# Patient Record
Sex: Female | Born: 1945 | Race: Black or African American | Hispanic: No | Smoking: Current every day smoker
Health system: Southern US, Community
[De-identification: ages and names within clinical notes are randomized; demographics above are authoritative.]

## PROBLEM LIST (undated history)

## (undated) DIAGNOSIS — C801 Malignant (primary) neoplasm, unspecified: Secondary | ICD-10-CM

## (undated) HISTORY — DX: Malignant (primary) neoplasm, unspecified: C80.1

## (undated) HISTORY — PX: COLECTOMY: SHX59

## (undated) HISTORY — PX: BELPHAROPTOSIS REPAIR: SHX369

## (undated) HISTORY — PX: OTHER SURGICAL HISTORY: SHX169

---

## 1998-02-03 ENCOUNTER — Ambulatory Visit (HOSPITAL_COMMUNITY): Admission: RE | Admit: 1998-02-03 | Discharge: 1998-02-03 | Payer: Self-pay | Admitting: Emergency Medicine

## 1999-01-08 ENCOUNTER — Encounter: Payer: Self-pay | Admitting: Emergency Medicine

## 1999-01-08 ENCOUNTER — Ambulatory Visit (HOSPITAL_COMMUNITY): Admission: RE | Admit: 1999-01-08 | Discharge: 1999-01-08 | Payer: Self-pay | Admitting: Emergency Medicine

## 2000-01-18 ENCOUNTER — Encounter: Payer: Self-pay | Admitting: Emergency Medicine

## 2000-01-18 ENCOUNTER — Ambulatory Visit (HOSPITAL_COMMUNITY): Admission: RE | Admit: 2000-01-18 | Discharge: 2000-01-18 | Payer: Self-pay | Admitting: Emergency Medicine

## 2000-01-30 ENCOUNTER — Other Ambulatory Visit: Admission: RE | Admit: 2000-01-30 | Discharge: 2000-01-30 | Payer: Self-pay | Admitting: Emergency Medicine

## 2001-01-26 ENCOUNTER — Encounter: Payer: Self-pay | Admitting: Emergency Medicine

## 2001-01-26 ENCOUNTER — Ambulatory Visit (HOSPITAL_COMMUNITY): Admission: RE | Admit: 2001-01-26 | Discharge: 2001-01-26 | Payer: Self-pay | Admitting: Emergency Medicine

## 2001-02-24 ENCOUNTER — Other Ambulatory Visit: Admission: RE | Admit: 2001-02-24 | Discharge: 2001-02-24 | Payer: Self-pay | Admitting: Emergency Medicine

## 2001-02-25 ENCOUNTER — Encounter: Payer: Self-pay | Admitting: Emergency Medicine

## 2001-02-25 ENCOUNTER — Encounter: Admission: RE | Admit: 2001-02-25 | Discharge: 2001-02-25 | Payer: Self-pay | Admitting: Emergency Medicine

## 2002-07-29 ENCOUNTER — Encounter: Admission: RE | Admit: 2002-07-29 | Discharge: 2002-07-29 | Payer: Self-pay | Admitting: Emergency Medicine

## 2002-07-29 ENCOUNTER — Encounter: Payer: Self-pay | Admitting: Emergency Medicine

## 2003-09-08 ENCOUNTER — Encounter: Admission: RE | Admit: 2003-09-08 | Discharge: 2003-09-08 | Payer: Self-pay | Admitting: Emergency Medicine

## 2003-09-08 ENCOUNTER — Encounter: Payer: Self-pay | Admitting: Emergency Medicine

## 2004-09-18 ENCOUNTER — Encounter: Admission: RE | Admit: 2004-09-18 | Discharge: 2004-09-18 | Payer: Self-pay | Admitting: Emergency Medicine

## 2005-11-06 ENCOUNTER — Encounter: Admission: RE | Admit: 2005-11-06 | Discharge: 2005-11-06 | Payer: Self-pay | Admitting: Emergency Medicine

## 2006-11-07 ENCOUNTER — Encounter: Admission: RE | Admit: 2006-11-07 | Discharge: 2006-11-07 | Payer: Self-pay | Admitting: Emergency Medicine

## 2007-12-04 ENCOUNTER — Encounter: Admission: RE | Admit: 2007-12-04 | Discharge: 2007-12-04 | Payer: Self-pay | Admitting: Emergency Medicine

## 2008-01-05 ENCOUNTER — Encounter: Admission: RE | Admit: 2008-01-05 | Discharge: 2008-01-05 | Payer: Self-pay | Admitting: Emergency Medicine

## 2008-12-06 ENCOUNTER — Encounter: Admission: RE | Admit: 2008-12-06 | Discharge: 2008-12-06 | Payer: Self-pay | Admitting: Emergency Medicine

## 2009-02-03 ENCOUNTER — Inpatient Hospital Stay (HOSPITAL_COMMUNITY): Admission: AD | Admit: 2009-02-03 | Discharge: 2009-02-03 | Payer: Self-pay | Admitting: Gastroenterology

## 2009-02-27 ENCOUNTER — Inpatient Hospital Stay (HOSPITAL_COMMUNITY): Admission: RE | Admit: 2009-02-27 | Discharge: 2009-03-05 | Payer: Self-pay | Admitting: General Surgery

## 2009-02-27 ENCOUNTER — Encounter (INDEPENDENT_AMBULATORY_CARE_PROVIDER_SITE_OTHER): Payer: Self-pay | Admitting: General Surgery

## 2009-03-10 ENCOUNTER — Ambulatory Visit: Payer: Self-pay | Admitting: Hematology and Oncology

## 2009-04-04 ENCOUNTER — Ambulatory Visit (HOSPITAL_COMMUNITY): Admission: RE | Admit: 2009-04-04 | Discharge: 2009-04-04 | Payer: Self-pay | Admitting: Hematology and Oncology

## 2009-06-20 ENCOUNTER — Ambulatory Visit: Payer: Self-pay | Admitting: Hematology and Oncology

## 2009-06-22 ENCOUNTER — Ambulatory Visit (HOSPITAL_COMMUNITY): Admission: RE | Admit: 2009-06-22 | Discharge: 2009-06-22 | Payer: Self-pay | Admitting: Hematology and Oncology

## 2009-06-22 LAB — CBC WITH DIFFERENTIAL/PLATELET
BASO%: 1 % (ref 0.0–2.0)
Basophils Absolute: 0 10*3/uL (ref 0.0–0.1)
EOS%: 0.4 % (ref 0.0–7.0)
Eosinophils Absolute: 0 10*3/uL (ref 0.0–0.5)
HCT: 41.1 % (ref 34.8–46.6)
HGB: 13.7 g/dL (ref 11.6–15.9)
LYMPH%: 26.8 % (ref 14.0–49.7)
MCH: 32.6 pg (ref 25.1–34.0)
MCHC: 33.4 g/dL (ref 31.5–36.0)
MCV: 97.7 fL (ref 79.5–101.0)
MONO#: 0.3 10*3/uL (ref 0.1–0.9)
MONO%: 6.3 % (ref 0.0–14.0)
NEUT#: 3 10*3/uL (ref 1.5–6.5)
NEUT%: 65.5 % (ref 38.4–76.8)
Platelets: 172 10*3/uL (ref 145–400)
RBC: 4.21 10*6/uL (ref 3.70–5.45)
RDW: 15 % — ABNORMAL HIGH (ref 11.2–14.5)
WBC: 4.5 10*3/uL (ref 3.9–10.3)
lymph#: 1.2 10*3/uL (ref 0.9–3.3)

## 2009-06-22 LAB — COMPREHENSIVE METABOLIC PANEL
ALT: 17 U/L (ref 0–35)
AST: 18 U/L (ref 0–37)
Albumin: 3.9 g/dL (ref 3.5–5.2)
Alkaline Phosphatase: 36 U/L — ABNORMAL LOW (ref 39–117)
BUN: 9 mg/dL (ref 6–23)
CO2: 27 mEq/L (ref 19–32)
Calcium: 9.5 mg/dL (ref 8.4–10.5)
Chloride: 105 mEq/L (ref 96–112)
Creatinine, Ser: 0.75 mg/dL (ref 0.40–1.20)
Glucose, Bld: 92 mg/dL (ref 70–99)
Potassium: 3.8 mEq/L (ref 3.5–5.3)
Sodium: 137 mEq/L (ref 135–145)
Total Bilirubin: 0.8 mg/dL (ref 0.3–1.2)
Total Protein: 7.5 g/dL (ref 6.0–8.3)

## 2009-06-22 LAB — CEA: CEA: 1.1 ng/mL (ref 0.0–5.0)

## 2009-12-07 ENCOUNTER — Encounter: Admission: RE | Admit: 2009-12-07 | Discharge: 2009-12-07 | Payer: Self-pay | Admitting: Internal Medicine

## 2010-01-01 ENCOUNTER — Ambulatory Visit: Payer: Self-pay | Admitting: Hematology and Oncology

## 2010-01-02 LAB — COMPREHENSIVE METABOLIC PANEL
ALT: 15 U/L (ref 0–35)
AST: 17 U/L (ref 0–37)
Albumin: 4.4 g/dL (ref 3.5–5.2)
Alkaline Phosphatase: 45 U/L (ref 39–117)
BUN: 11 mg/dL (ref 6–23)
CO2: 22 mEq/L (ref 19–32)
Calcium: 9.5 mg/dL (ref 8.4–10.5)
Chloride: 105 mEq/L (ref 96–112)
Creatinine, Ser: 0.77 mg/dL (ref 0.40–1.20)
Glucose, Bld: 81 mg/dL (ref 70–99)
Potassium: 3.8 mEq/L (ref 3.5–5.3)
Sodium: 140 mEq/L (ref 135–145)
Total Bilirubin: 1.2 mg/dL (ref 0.3–1.2)
Total Protein: 7.5 g/dL (ref 6.0–8.3)

## 2010-01-02 LAB — CBC WITH DIFFERENTIAL/PLATELET
BASO%: 0.3 % (ref 0.0–2.0)
Basophils Absolute: 0 10*3/uL (ref 0.0–0.1)
EOS%: 0.5 % (ref 0.0–7.0)
Eosinophils Absolute: 0 10*3/uL (ref 0.0–0.5)
HCT: 40.2 % (ref 34.8–46.6)
HGB: 13.6 g/dL (ref 11.6–15.9)
LYMPH%: 36.3 % (ref 14.0–49.7)
MCH: 34.3 pg — ABNORMAL HIGH (ref 25.1–34.0)
MCHC: 33.9 g/dL (ref 31.5–36.0)
MCV: 101.2 fL — ABNORMAL HIGH (ref 79.5–101.0)
MONO#: 0.4 10*3/uL (ref 0.1–0.9)
MONO%: 7.7 % (ref 0.0–14.0)
NEUT#: 2.6 10*3/uL (ref 1.5–6.5)
NEUT%: 55.2 % (ref 38.4–76.8)
Platelets: 192 10*3/uL (ref 145–400)
RBC: 3.97 10*6/uL (ref 3.70–5.45)
RDW: 12.7 % (ref 11.2–14.5)
WBC: 4.7 10*3/uL (ref 3.9–10.3)
lymph#: 1.7 10*3/uL (ref 0.9–3.3)

## 2010-01-02 LAB — CEA: CEA: 2 ng/mL (ref 0.0–5.0)

## 2010-06-28 ENCOUNTER — Ambulatory Visit: Payer: Self-pay | Admitting: Hematology and Oncology

## 2010-07-02 ENCOUNTER — Ambulatory Visit (HOSPITAL_COMMUNITY): Admission: RE | Admit: 2010-07-02 | Discharge: 2010-07-02 | Payer: Self-pay | Admitting: Hematology and Oncology

## 2010-07-02 LAB — CBC WITH DIFFERENTIAL/PLATELET
BASO%: 0.2 % (ref 0.0–2.0)
Basophils Absolute: 0 10*3/uL (ref 0.0–0.1)
EOS%: 0.6 % (ref 0.0–7.0)
Eosinophils Absolute: 0 10*3/uL (ref 0.0–0.5)
HCT: 39.3 % (ref 34.8–46.6)
HGB: 13.3 g/dL (ref 11.6–15.9)
LYMPH%: 39.4 % (ref 14.0–49.7)
MCH: 33.5 pg (ref 25.1–34.0)
MCHC: 33.8 g/dL (ref 31.5–36.0)
MCV: 99 fL (ref 79.5–101.0)
MONO#: 0.3 10*3/uL (ref 0.1–0.9)
MONO%: 6.3 % (ref 0.0–14.0)
NEUT#: 2.5 10*3/uL (ref 1.5–6.5)
NEUT%: 53.5 % (ref 38.4–76.8)
Platelets: 173 10*3/uL (ref 145–400)
RBC: 3.97 10*6/uL (ref 3.70–5.45)
RDW: 12.5 % (ref 11.2–14.5)
WBC: 4.8 10*3/uL (ref 3.9–10.3)
lymph#: 1.9 10*3/uL (ref 0.9–3.3)

## 2010-07-02 LAB — COMPREHENSIVE METABOLIC PANEL
ALT: 19 U/L (ref 0–35)
AST: 21 U/L (ref 0–37)
Albumin: 3.8 g/dL (ref 3.5–5.2)
Alkaline Phosphatase: 37 U/L — ABNORMAL LOW (ref 39–117)
BUN: 9 mg/dL (ref 6–23)
CO2: 27 mEq/L (ref 19–32)
Calcium: 9 mg/dL (ref 8.4–10.5)
Chloride: 106 mEq/L (ref 96–112)
Creatinine, Ser: 0.75 mg/dL (ref 0.40–1.20)
Glucose, Bld: 90 mg/dL (ref 70–99)
Potassium: 3.8 mEq/L (ref 3.5–5.3)
Sodium: 140 mEq/L (ref 135–145)
Total Bilirubin: 0.9 mg/dL (ref 0.3–1.2)
Total Protein: 7.6 g/dL (ref 6.0–8.3)

## 2010-07-02 LAB — CEA: CEA: 1.9 ng/mL (ref 0.0–5.0)

## 2010-07-09 ENCOUNTER — Ambulatory Visit (HOSPITAL_COMMUNITY): Admission: RE | Admit: 2010-07-09 | Discharge: 2010-07-09 | Payer: Self-pay | Admitting: Hematology and Oncology

## 2010-07-18 ENCOUNTER — Ambulatory Visit: Payer: Self-pay | Admitting: Thoracic Surgery

## 2010-09-28 ENCOUNTER — Ambulatory Visit: Payer: Self-pay | Admitting: Hematology and Oncology

## 2010-10-02 ENCOUNTER — Ambulatory Visit (HOSPITAL_COMMUNITY): Admission: RE | Admit: 2010-10-02 | Discharge: 2010-10-02 | Payer: Self-pay | Admitting: Hematology and Oncology

## 2010-10-02 LAB — CBC WITH DIFFERENTIAL/PLATELET
BASO%: 0.2 % (ref 0.0–2.0)
Basophils Absolute: 0 10*3/uL (ref 0.0–0.1)
EOS%: 0.9 % (ref 0.0–7.0)
Eosinophils Absolute: 0.1 10*3/uL (ref 0.0–0.5)
HCT: 40.3 % (ref 34.8–46.6)
HGB: 14 g/dL (ref 11.6–15.9)
LYMPH%: 34.1 % (ref 14.0–49.7)
MCH: 33.8 pg (ref 25.1–34.0)
MCHC: 34.7 g/dL (ref 31.5–36.0)
MCV: 97.3 fL (ref 79.5–101.0)
MONO#: 0.5 10*3/uL (ref 0.1–0.9)
MONO%: 8.2 % (ref 0.0–14.0)
NEUT#: 3.1 10*3/uL (ref 1.5–6.5)
NEUT%: 56.6 % (ref 38.4–76.8)
Platelets: 189 10*3/uL (ref 145–400)
RBC: 4.14 10*6/uL (ref 3.70–5.45)
RDW: 12.7 % (ref 11.2–14.5)
WBC: 5.5 10*3/uL (ref 3.9–10.3)
lymph#: 1.9 10*3/uL (ref 0.9–3.3)
nRBC: 0 % (ref 0–0)

## 2010-10-02 LAB — COMPREHENSIVE METABOLIC PANEL
ALT: 14 U/L (ref 0–35)
AST: 18 U/L (ref 0–37)
Albumin: 4 g/dL (ref 3.5–5.2)
Alkaline Phosphatase: 35 U/L — ABNORMAL LOW (ref 39–117)
BUN: 8 mg/dL (ref 6–23)
CO2: 25 mEq/L (ref 19–32)
Calcium: 9.5 mg/dL (ref 8.4–10.5)
Chloride: 107 mEq/L (ref 96–112)
Creatinine, Ser: 0.82 mg/dL (ref 0.40–1.20)
Glucose, Bld: 82 mg/dL (ref 70–99)
Potassium: 3.8 mEq/L (ref 3.5–5.3)
Sodium: 140 mEq/L (ref 135–145)
Total Bilirubin: 1 mg/dL (ref 0.3–1.2)
Total Protein: 7.8 g/dL (ref 6.0–8.3)

## 2010-10-02 LAB — CEA: CEA: 1.6 ng/mL (ref 0.0–5.0)

## 2010-10-09 ENCOUNTER — Ambulatory Visit: Payer: Self-pay | Admitting: Thoracic Surgery

## 2010-12-08 ENCOUNTER — Other Ambulatory Visit: Payer: Self-pay | Admitting: Internal Medicine

## 2010-12-08 DIAGNOSIS — Z1239 Encounter for other screening for malignant neoplasm of breast: Secondary | ICD-10-CM

## 2010-12-09 ENCOUNTER — Encounter: Payer: Self-pay | Admitting: Emergency Medicine

## 2010-12-21 ENCOUNTER — Ambulatory Visit
Admission: RE | Admit: 2010-12-21 | Discharge: 2010-12-21 | Disposition: A | Discharge status: Home / Self Care | Payer: PRIVATE HEALTH INSURANCE | Source: Ambulatory Visit | Attending: Internal Medicine | Admitting: Internal Medicine

## 2010-12-21 DIAGNOSIS — Z1239 Encounter for other screening for malignant neoplasm of breast: Secondary | ICD-10-CM

## 2011-01-22 ENCOUNTER — Other Ambulatory Visit: Payer: Self-pay | Admitting: Hematology and Oncology

## 2011-01-22 ENCOUNTER — Encounter (HOSPITAL_BASED_OUTPATIENT_CLINIC_OR_DEPARTMENT_OTHER): Payer: Medicare Other | Admitting: Hematology and Oncology

## 2011-01-22 DIAGNOSIS — C182 Malignant neoplasm of ascending colon: Secondary | ICD-10-CM

## 2011-01-22 DIAGNOSIS — C189 Malignant neoplasm of colon, unspecified: Secondary | ICD-10-CM

## 2011-01-22 LAB — COMPREHENSIVE METABOLIC PANEL
ALT: 12 U/L (ref 0–35)
AST: 15 U/L (ref 0–37)
Albumin: 4.1 g/dL (ref 3.5–5.2)
Alkaline Phosphatase: 46 U/L (ref 39–117)
BUN: 12 mg/dL (ref 6–23)
CO2: 25 mEq/L (ref 19–32)
Calcium: 9.3 mg/dL (ref 8.4–10.5)
Chloride: 105 mEq/L (ref 96–112)
Creatinine, Ser: 0.82 mg/dL (ref 0.40–1.20)
Glucose, Bld: 92 mg/dL (ref 70–99)
Potassium: 4 mEq/L (ref 3.5–5.3)
Sodium: 140 mEq/L (ref 135–145)
Total Bilirubin: 0.4 mg/dL (ref 0.3–1.2)
Total Protein: 6.6 g/dL (ref 6.0–8.3)

## 2011-01-22 LAB — CBC WITH DIFFERENTIAL/PLATELET
BASO%: 0.2 % (ref 0.0–2.0)
Basophils Absolute: 0 10*3/uL (ref 0.0–0.1)
EOS%: 1 % (ref 0.0–7.0)
Eosinophils Absolute: 0.1 10*3/uL (ref 0.0–0.5)
HCT: 37.4 % (ref 34.8–46.6)
HGB: 12.8 g/dL (ref 11.6–15.9)
LYMPH%: 33.8 % (ref 14.0–49.7)
MCH: 33.5 pg (ref 25.1–34.0)
MCHC: 34.2 g/dL (ref 31.5–36.0)
MCV: 97.9 fL (ref 79.5–101.0)
MONO#: 0.4 10*3/uL (ref 0.1–0.9)
MONO%: 8.2 % (ref 0.0–14.0)
NEUT#: 3 10*3/uL (ref 1.5–6.5)
NEUT%: 56.8 % (ref 38.4–76.8)
Platelets: 159 10*3/uL (ref 145–400)
RBC: 3.82 10*6/uL (ref 3.70–5.45)
RDW: 12.8 % (ref 11.2–14.5)
WBC: 5.3 10*3/uL (ref 3.9–10.3)
lymph#: 1.8 10*3/uL (ref 0.9–3.3)
nRBC: 0 % (ref 0–0)

## 2011-01-22 LAB — CEA: CEA: 2.4 ng/mL (ref 0.0–5.0)

## 2011-02-01 LAB — GLUCOSE, CAPILLARY: Glucose-Capillary: 101 mg/dL — ABNORMAL HIGH (ref 70–99)

## 2011-02-22 ENCOUNTER — Other Ambulatory Visit: Payer: Self-pay | Admitting: Thoracic Surgery

## 2011-02-22 DIAGNOSIS — R911 Solitary pulmonary nodule: Secondary | ICD-10-CM

## 2011-02-26 LAB — GLUCOSE, CAPILLARY: Glucose-Capillary: 95 mg/dL (ref 70–99)

## 2011-02-27 LAB — COMPREHENSIVE METABOLIC PANEL
ALT: 10 U/L (ref 0–35)
AST: 14 U/L (ref 0–37)
Albumin: 3.4 g/dL — ABNORMAL LOW (ref 3.5–5.2)
Alkaline Phosphatase: 44 U/L (ref 39–117)
BUN: 3 mg/dL — ABNORMAL LOW (ref 6–23)
CO2: 23 mEq/L (ref 19–32)
Calcium: 8.8 mg/dL (ref 8.4–10.5)
Chloride: 110 mEq/L (ref 96–112)
Creatinine, Ser: 0.72 mg/dL (ref 0.4–1.2)
GFR calc Af Amer: >60 mL/min (ref >60)
GFR calc non Af Amer: >60 mL/min (ref >60)
Glucose, Bld: 89 mg/dL (ref 70–99)
Potassium: 3.8 mEq/L (ref 3.5–5.1)
Sodium: 140 mEq/L (ref 135–145)
Total Bilirubin: 0.8 mg/dL (ref 0.3–1.2)
Total Protein: 6.8 g/dL (ref 6.0–8.3)

## 2011-02-27 LAB — CBC
HCT: 34.8 % — ABNORMAL LOW (ref 36.0–46.0)
Hemoglobin: 11.5 g/dL — ABNORMAL LOW (ref 12.0–15.0)
MCHC: 33 g/dL (ref 30.0–36.0)
MCV: 100.8 fL — ABNORMAL HIGH (ref 78.0–100.0)
Platelets: 239 10*3/uL (ref 150–400)
RBC: 3.46 MIL/uL — ABNORMAL LOW (ref 3.87–5.11)
RDW: 14.4 % (ref 11.5–15.5)
WBC: 6.2 10*3/uL (ref 4.0–10.5)

## 2011-02-27 LAB — URINALYSIS, ROUTINE W REFLEX MICROSCOPIC
Bilirubin Urine: NEGATIVE
Glucose, UA: NEGATIVE mg/dL
Hgb urine dipstick: NEGATIVE
Ketones, ur: NEGATIVE mg/dL
Nitrite: NEGATIVE
Protein, ur: NEGATIVE mg/dL
Specific Gravity, Urine: 1.016 (ref 1.005–1.030)
Urobilinogen, UA: 1 mg/dL (ref 0.0–1.0)
pH: 7 (ref 5.0–8.0)

## 2011-02-27 LAB — TYPE AND SCREEN
ABO/RH(D): B POS
Antibody Screen: NEGATIVE

## 2011-02-27 LAB — DIFFERENTIAL
Basophils Absolute: 0 10*3/uL (ref 0.0–0.1)
Basophils Relative: 1 % (ref 0–1)
Eosinophils Absolute: 0.2 10*3/uL (ref 0.0–0.7)
Eosinophils Relative: 4 % (ref 0–5)
Lymphocytes Relative: 29 % (ref 12–46)
Lymphs Abs: 1.8 10*3/uL (ref 0.7–4.0)
Monocytes Absolute: 0.4 10*3/uL (ref 0.1–1.0)
Monocytes Relative: 7 % (ref 3–12)
Neutro Abs: 3.7 10*3/uL (ref 1.7–7.7)
Neutrophils Relative %: 60 % (ref 43–77)

## 2011-02-27 LAB — TSH: TSH: 0.491 u[IU]/mL (ref 0.350–4.500)

## 2011-02-27 LAB — ABO/RH: ABO/RH(D): B POS

## 2011-02-27 LAB — CEA: CEA: 97 ng/mL — ABNORMAL HIGH (ref 0.0–5.0)

## 2011-02-28 LAB — CEA: CEA: 74.7 ng/mL — ABNORMAL HIGH (ref 0.0–5.0)

## 2011-04-02 NOTE — Consult Note (Signed)
NAMEZAIN, BINGMAN               ACCOUNT NO.:  [id number]   MEDICAL RECORD NO.:  [id number]          PATIENT TYPE:  INP   LOCATION:  1341                         FACILITY:  [facility]   PHYSICIAN:  Ardeth Sportsman, MD     DATE OF BIRTH:  06-Oct-1946   DATE OF CONSULTATION:  02/03/2009  DATE OF DISCHARGE:                                 CONSULTATION   PRIMARY CARE PHYSICIAN:  Leslee Home, MD, of [facility].   GASTROENTEROLOGIST:  Charna Elizabeth, MD, [facility].   REASON FOR CONSULTATION:  Large colon mass near obstructing, probable  cancer.   HISTORY:  Ms. Belknap is a 65 year old female, who is relatively healthy.  She was recommended to have screening colonoscopy.  According to her  primary care physician, she has cancelled a few times before but did  actually go ahead and proceed this time.  I was called today by Dr. Loreta Ave  based on concerns on endoscopy.  Dr. Loreta Ave found a couple of polyps on  the left side that she was able to remove endoscopically, but she found  a very large mass on the right side of the colon.  She could not get the  scope proximal to it.  The patient had been able to tolerate a bowel  prep, though.  Given some issues of the large mass with some obstructive  symptoms according to Dr. Loreta Ave and concerns about insight, a  recommendation was made for admission and workup and probable colectomy  by admission.   Dr. Loreta Ave asked me to accept the patient on admission and I did.   PAST MEDICAL HISTORY:  1. 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  2. Osteoporosis.  3. Multinodular goiter.  4. Arthritis.   PAST SURGICAL HISTORY:  1. 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  2. Removal of a mass behind her right ear.  3. She has never had any abdominal surgeries.   MEDICATIONS:  Folic acid, methotrexate, prednisolone, and Istalol,  primarily topical ophthalmic treatments.   ALLERGIES:  None known.   SOCIAL HISTORY:  She is  divorced, she has 1 child, who is supposed to  call her once he gets out of work later today.  She smokes about a half-  pack a day, giving her maybe a 20-pack-year history of tobacco.  Denies  any alcohol or other drug use.   FAMILY HISTORY:  She cannot recall any history of any colon, breast,  cervical, endometrial, or ovarian cancer.  Her father has some  hypertension, and both parents have arthritis issues.   The patient does not recall any history of inflammatory bowel disease,  irritable bowel syndrome, and other abnormalities.   REVIEW OF SYSTEMS:  No fevers, chills, or sweats.  No weight gain or  weight loss.  EYES:  As noted above with vision loss and followed for  this.  ENDOCRINE:  Negative.  DERMATOLOGIC:  Negative.  MUSCULOSKELETAL:  Negative.  RESPIRATORY:  No cold, cough, or flu, no history of asthma,  she does smoke.  CARDIAC:  She claims she walks about 3 miles  a day and  has excellent exercise tolerance.  She has no known history of coronary  disease or exertional angina.  GI:  As noted above.  Reports are notable  in that she occasionally has some bloating with some constipation  requiring laxatives.  She has not had any severe abdominal pain.  She  does have some hyperflatulence.  No dysphagia or odynophagia.  PSYCH:  Negative.  GENITOURINARY:  Negative.  GYN:  Pap smear she claims is  negative.  BREASTS:  No nipple discharge or solid lumps.  She had a  mammogram done last year that she said was negative.  No personal  history of breast cancer.   PHYSICAL EXAMINATION:  VITAL SIGNS:  Her BMI is around 22.  Temperature  is 98.4, pulse 84, respirations 20, blood pressure 164/97, 99% SAT on  room air.  GENERAL:  She is a well-developed, well-nourished female sitting in no  acute distress.  PSYCH:  She seems pleasant and interactive and is very inquisitive when  asked numerous questions.  Her memory seems normal.  I do not detect any  strong evidence of any dementia,  delirium, psychosis, or paranoia.  EYES:  Her sclerae are somewhat injected.  She has had a vision loss in  her right eye.  Her extraocular movements appear to be intact.  HEENT:  She is normocephalic, mucous membranes are moist, nasopharynx  and oropharynx are clear.  NECK:  Supple without any masses, trachea is midline.  HEART:  Regular rate and rhythm, no murmurs, gallops, or rubs.  CHEST:  Clear to auscultation bilaterally, no wheezes, rales, or  rhonchi.  BACK:  No pain on compression on her back.  ABDOMEN:  Soft, flat, nontender, nondistended, no umbilical hernias.  GENITAL:  She has no inguinal hernias.  RECTAL:  Deferred given her recent colonoscopy.  MUSCULOSKELETAL:  She has full range of motion in the shoulders, elbows,  and wrists as well as the hips, knees, and ankles.  NEUROLOGICAL:  Cranial nerves II-XII are intact, hand grip is 5/5 equal  and symmetrical, and no resting or intention tremor.  SKIN:  No obvious petechiae or purpura.  No other source of lesions.   BLOOD WORK:  Her hemoglobin is 12.4 in the normal range.  Her  electrolytes are in the normal range with a glucose of 79 and creatinine  0.87.   Colonoscopy as noted above with a moderately large descending colon  polyp and rectal polyp that Dr. Loreta Ave said she was able to completely  excise, and obviously a large mass in the right colon that she believes  is in the ascending colon and that she cannot get the scope back,  fungating, and strongly for cancer.  I did call Pathology and the  specimen is being processed but has not been read since it was done  earlier today.   ASSESSMENT/PLAN:  A 65 year old female, who is otherwise healthy with  good cardiac function, with a large endoscopically obstructing mass with  some mild obstructive symptoms.   Anatomy and physiology of the digestive tract explained.  Pathophysiology of colorectal polyps and colon cancer was discussed and  handouts were given on this.   Recommendation was made for partial  colectomy.  I think she would be a good candidate for laparoscopically-  assisted for a small incision and less pain.  We also would use an anti-  ileus Entereg protocol.  Risks and benefits are discussed.  She had  questions on this but seemed  to express understanding.   I came up to the meet the patient and introduced myself, and the patient  wished to talk to her primary care physician, wanted to hear what her  input from her primary care physician is.  Eventually, I was able to get  hold of Dr. Lorenz Coaster and talked to him about it.  He felt that would be a  good idea to get worked up and proceed with a partial colectomy soon.  I  explained this to Ms. Knerr.  She wished to talk to Dr. Lorenz Coaster herself.  I gave her the phone number that I had eventually found.  She was  initially not able to get hold of him, but after I left the room said  got hold of him and said he was due to call her back.  The patient  initially said she wanted to go to [facility].  She had  questions about my specialty.  I explained I was a Development worker, international aid with  advanced minimally-invasive training and interest in the digestive tract  especially esophagus and colorectal surgery as well and that I had done  that colon surgery very often.  She still wanted to get input from Dr.  Lorenz Coaster.  I gave her handouts on management of colorectal polyps and  colon cancer with the help of her floor nurse, who was able to assist me  on clarifying issues and who was with me when I had conversations with  Ms. Riese.  I am waiting to get input from Dr. Lorenz Coaster, the patient  insisting on what she wants to do.   She is debating whether she wants to go ahead and proceed with care here  versus go to [facility].  I strongly encouraged that she needs to have  colectomy done soon as the mass is very large and I worry that she could  run into problems such as obstruction or bleeding or  perforation if she  ignores this issue.  Given the fact that she had numerous questions and  difficulty with the bowel prep, according to Dr. Loreta Ave, and that her  primary care physician has noted that she has cancelled her  colonoscopies in the past, I worry that she may wait too long until it  becomes an emergency or a may serious problem with greater complication  risks such as ostomy or abscesses or other more serious problems if this  becomes an emergency situation.  I am awaiting input and we will see if  she is fine with me proceeding with surgery, and I will complete her  workup including a CEA, a CT scan of the abdomen and pelvis to make sure  there is no metastatic disease and maybe help identify the location of  this mass radiographically versus an enema for localization, and  consider of a colectomy maybe as soon as tomorrow versus sooner.   ADDENDUM  The patient was able to reach Dr. Lorenz Coaster.  She wished Dr. Derrell Lolling to be  her surgeon and operate on her tonight versus tomorrow (Saturday).  I  explained that Dr. Derrell Lolling is not available over the weekend.  She wished  to go home and follow up with Dr. Derrell Lolling soon as an outpatient.  She is  okay with completing the workup so a Ct scan of the chest/abdomen/pelvis  was done and a CEA level was drawn.  She is tolerating PO without  abdominal pain of obstipation, so I think it is okay to go  home, but I  stressed she needs surgical follow-up within the next week to avoid  obstruction or perforation if this is ignored.  I recommended she have a  partial colectomy.  I gave her 2 cards from our office (she misplaced  the 1st) and left written instructions and literature on colorectal  polyps/cancer as well.      Ardeth Sportsman, MD  Electronically Signed     SCG/MEDQ  D:  02/03/2009  T:  02/03/2009  Job:  [id number]   cc:   Reuben Likes, M.D.  Fax: [phone]

## 2011-04-02 NOTE — Op Note (Signed)
[name], [name]               ACCOUNT NO.:  [id number]   MEDICAL RECORD NO.:  [id number]          PATIENT TYPE:  INP   LOCATION:  1531                         FACILITY:  [facility]   PHYSICIAN:  Angelia Mould. Derrell Lolling, M.D.DATE OF BIRTH:  02-21-1946   DATE OF PROCEDURE:  02/27/2009  DATE OF DISCHARGE:                               OPERATIVE REPORT   PREOPERATIVE DIAGNOSIS:  Neoplastic mass of the right colon, malignancy  suspected.   POSTOPERATIVE DIAGNOSIS:  Neoplastic mass of the right colon, malignancy  suspected.   OPERATION PERFORMED:  Laparoscopic-assisted right colectomy with primary  anastomosis.   SURGEON:  Dr. Claud Kelp   FIRST ASSISTANT:  Wilmon Arms. Tsuei, M.D.   OPERATIVE INDICATIONS:  A 65 year old [place] American female who really  does not have any GI symptoms but underwent a screening colonoscopy  recently.  The significant finding was a very large mass in the right  colon and Dr. Loreta Ave could not traverse the tumor with the colonoscope.  Biopsy showed villous adenoma with high-grade dysplasia.  CT scan has  been performed.  This shows a large soft tissue mass in the right colon  consistent with tumor seen on colonoscopy.  There was some tiny  hypodensities in the left liver, too small to characterize.  There were  bilateral thyroid nodules consistent with multinodular goiter.  There  were some tiny lymph nodes in the right colon mesentery.  There was a 14  mm sclerotic lesion in the right iliac crest of uncertain etiology.  Initial CEA level was 74.7, raising concern that this was  adenocarcinoma.  The patient was advised to undergo segmental resection  of this area and is brought to operating room electively following a  bowel prep at home.   OPERATIVE FINDINGS:  The patient had a bulky mass in the mid ascending  colon just below the hepatic flexure.  This was very mobile and had not  invaded any of the surrounding tissues.  There were some palpably  enlarged lymph nodes in the mesentery along the ileocolic artery which  were resected, although it is not clear whether these were inflammatory  or neoplastic.  The right and left lobes of the liver looked normal.  The gallbladder looked normal.  The stomach and duodenum looked normal.  The small bowel and peritoneal surfaces looked normal.  There was no  ascites.   OPERATIVE TECHNIQUE:  Following induction of general endotracheal  anesthesia a Foley catheter and orogastric tube were placed.  The  patient's abdomen was prepped and draped in a sterile fashion.  Intravenous antibiotics were given.  The patient was identified as  correct patient and correct procedure.  0.5% Marcaine with epinephrine  was used as local infiltration anesthetic.  A vertically oriented  incision was made at the superior rim of the umbilicus.  The fascia was  incised the midline and the abdominal cavity entered under direct  vision.  An 11-mm Hassan trocar was inserted and secured the pursestring  suture of 0 Vicryl.  Pneumoperitoneum was created.  Video camera was  inserted with visualization findings as described above.  A 5-mm trocar  was placed in the lower midline and a 5-mm trocar placed in the left  midabdomen.   With the patient in a variety of positions.  We identified the terminal  ileum and the appendix, the right colon, the hepatic flexure, the  gallbladder, the stomach, pylorus and duodenum.  We started our  dissection using the harmonic scalpel as an energy source and with some  blunt dissection.  We scored the mesentery lateral to the ileocecal  valve and then scored the mesentery up toward the terminal ileum.  We  then divided the mesentery of the right colon all the way up the right  paracolic gutter and then with a sweeping motion and some cutting motion  completely mobilize the right colon out of the right paracolic gutter.   We then took down the hepatic flexure and the proximal transverse  colon  using the harmonic scalpel.  We ultimately had a very thorough  mobilization of the terminal ileum, right colon, hepatic flexure.  We  inspected the pylorus and the duodenum and they looked fine.   At this point we released the pneumoperitoneum.  We made a short 7 or 8  cm incision which extended both above and below the umbilicus.  The  fascia was incised in the midline with the cautery.  A protractor wound  protector was placed.  The right colon was then delivered through the  wound without any difficulty.  We could palpate the mass in the right  colon and it appeared to be a good 6-8 cm in size but did not obviously  showing signs of invasion through the wall.   We identified the middle colic vessels and chose to divide the right  transverse colon just to the right of this to preserve the middle colic  vessels.  The right transverse colon was then divided with the GIA  stapling device.  After cleaning off the mesentery of terminal ileum.  We divided the terminal ileum with the GIA stapling device.  The  mesentery was then divided.  Smaller vessels were divided with the  LigaSure device and the ileocolic vessels were clamped, divided and  ligated with 2-0 silk ties.  The specimen was then removed and sent for  routine histology.  The area of dissection was irrigated.  There was no  bleeding.  We closed the mesentery with some interrupted sutures of 2-0  silk.  We then set up the anastomosis between the terminal ileum and the  mid transverse colon.  This anastomosis was created using a GIA stapling  device and the defect in the bowel wall was closed with TA-60 stapling  device.  There is no bleeding from the staple lines.  We closed the rest  of the mesentery with interrupted sutures of 2-0 silk.   We changed our gloves and instruments at this point.  We inspected the  terminal ileum, the anastomosis and the transverse colon.  We inspected  the anatomy and everything seemed to  be fine.  There did not seem to be  any twisting or kinking anywhere.  We returned the bowel to the  abdominal cavity then irrigated with saline removing all the irrigation  fluid.  There did not seem to be any bleeding.  The midline fascia was  then closed with running suture of #1 PDS and skin closed with skin  staple.  We then reinsufflated the abdomen and looked down in the  pelvis, right paracolic gutter and liver  area and the anastomosis and  saw no evidence of any bleeding.  We draped the omentum back down the  anastomosis and the small bowel and felt that we had done all that  needed to be done.  The trocars were removed and the pneumoperitoneum  released.  The skin incisions where the 5-mm trocars were  placed were closed with skin staples.  Clean bandages were placed and  the patient taken recovery room in stable condition.  Estimated blood  loss was 50-100 mL.  Complications none.  Sponge, needle and instrument  counts were correct.      Angelia Mould. Derrell Lolling, M.D.  Electronically Signed     HMI/MEDQ  D:  02/27/2009  T:  02/27/2009  Job:  [id number]   cc:   Reuben Likes, M.D.  Fax: [phone]   JXBJYN WGN FAOZ, M.D.  Fax: [phone]

## 2011-04-02 NOTE — Discharge Summary (Signed)
[name], [name]               ACCOUNT NO.:  [id number]   MEDICAL RECORD NO.:  [id number]          PATIENT TYPE:  INP   LOCATION:  1531                         FACILITY:  [facility]   PHYSICIAN:  Angelia Mould. Derrell Lolling, M.D.DATE OF BIRTH:  1946-05-20   DATE OF ADMISSION:  02/27/2009  DATE OF DISCHARGE:  03/05/2009                               DISCHARGE SUMMARY   FINAL DIAGNOSES:  1. 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  2. History of multinodular goiter.  3. Osteoporosis.  4. Status post failed right corneal transplant.   OPERATION PERFORMED:  Laparoscopic-assisted right colectomy, date February 27, 2009.   HISTORY:  This is a 65 year old African American female who had a  screening colonoscopy by Dr. Loreta Ave which showed a very large mass of the  right colon which was partially obstructing the lumen.  A biopsy showed  villous adenoma with high-grade dysplasia.  She was evaluated as an  outpatient.  Denied abdominal pain or cramping or any obstructive  symptoms.  She was advised to undergo elective colon surgery which she  agreed to.  A CT scan of the chest, abdomen, and pelvis showed multiple  bilateral thyroid nodules and a soft tissue mass of the distal ascending  colon consistent with the tumor seen on colonoscopy, some tiny  hypodensities in the left liver too small to characterize.  A CEA level  was elevated to 74.7.  TSH level was 0.761.  Hemoglobin 12.4, BUN 12,  creatinine 0.87.  Liver function tests were normal.   She underwent a bowel prep at home and was brought to the hospital  electively.   HOSPITAL COURSE:  On the day of admission, the patient was taken to the  operating room and underwent a laparoscopic-assisted right colectomy.  There was no visual gross evidence of metastatic disease.  The surgery  was uneventful.   The final pathology report showed an invasive colonic adenocarcinoma,  moderately  differentiated.  Zero out of 18 lymph nodes examined showed  cancer for a pathologic stage T3N0.  The patient was advised of her  stage.   She progressed fairly well in the hospital.  Her bowel function began on  about the second or third postop day and we advanced her diet and  activities thereafter.  She was ready for discharge on March 05, 2009,  at which time she was  tolerating a regular diet, having bowel movements, and having no  complaints.  Her wound looked good.  Her IV was removed and she was  discharged.  She was given a prescription of Percocet for pain.  She was  asked to follow up with me in the office in 3 days.      Angelia Mould. Derrell Lolling, M.D.  Electronically Signed     HMI/MEDQ  D:  04/18/2009  T:  04/18/2009  Job:  [id number]   cc:   Reuben Likes, M.D.  Fax: [phone]   QMVHQI ONG EXBM, M.D.  Fax: [phone]

## 2011-04-02 NOTE — Assessment & Plan Note (Signed)
OFFICE VISIT   [name], [name]  DOB:  1946-07-06                                        October 09, 2010  CHART #:  [id number]   HISTORY:  The patient is here today for a 46-month followup CT scan  regarding a right middle lobe 5.9-mm nodule.  She has a history of colon  cancer status post partial colectomy by Dr. Derrell Lolling.  She also recently  had a cyst removed from her anterior chest wall.  The patient presents  today without complaints.  She denies any chest pain, shortness of  breath, hemoptysis, fevers, chills, night sweats, weight loss.   PHYSICAL EXAMINATION:  Vital Signs:  Blood pressure 140/92, pulse 106,  respirations of 18, O2 saturations 98% on room air.  Respiratory:  Clear  to auscultation bilaterally.  Cardiac:  Regular rate and rhythm.   STUDIES:  The patient had a CT scan of the chest, abdomen and pelvis  with contrast on October 02, 2010.  This shows a stable 6-mm  indeterminate pulmonary nodule in the right middle lobe.  There is no  evidence of soft tissue metastatic disease or other acute findings  within the abdomen or pelvis.   IMPRESSION AND PLAN:  The patient was seen and evaluated by Dr. Edwyna Shell.  Dr. Edwyna Shell discussed with the patient findings of her CT scan showing  that the right middle lobe nodule is unchanged.  Plan will be to follow  up with the patient in 6 months with a super D protocol CT scan with  contrast at that time.  The patient is in agreement.   Sol Blazing, PA   KMD/MEDQ  D:  10/09/2010  T:  10/09/2010  Job:  [id number]

## 2011-04-02 NOTE — Letter (Signed)
July 18, 2010   Lauretta I. Odogwu, MD  [address]  [place], [place] [address]   Re:  [name], [name]               DOB:  1946-01-13   Dear Vicente Serene, I appreciate the opportunity of seeing the patient.  This  is a 65 year old patient who had a previous colon cancer and with a  partial colectomy by Dr. Derrell Lolling.  He also recently had a cyst removed  her anterior chest wall.  On followup CT scans, she was found to have a  5.9-mm nodule in the right middle lobe that apparently was not there  previously.  He was referred here for evaluation.  She had no nodes at  the time of her surgery.  Her medication include prednisolone eye drops,  Fosamax, fish oil, vitamin D, calcium.  She has no allergies.  She has  hypercholesterolemia and family history is noncontributory.   SOCIAL HISTORY:  She is a widow with one child.  Retired.  Smokes 4  cigarettes a day.  He does not drink alcohol on a regular basis.   REVIEW OF SYSTEMS:  CONSTITUTIONAL:  She is 130 pounds.  She is 5 feet  5.  GENERAL:  Her weight has been stable.  CARDIAC:  No angina or atrial  fibrillation.  PULMONARY:  No hemoptysis or bronchitis.  GI:  See history of present illness.  GU:  No kidney disease, dysuria or frequent urination.  VASCULAR:  No claudication, DVT, TIA's.  NEUROLOGIC:  No dizziness headaches, blackouts, seizures.  MUSCULOSKELETAL:  No arthritis.  PSYCHIATRIC:  No depression or nervous.  EYE/ENT:  No change in eyesight or hearing.  HEMATOLOGIC:  No problems with bleeding, clotting disorders or anemia.   PHYSICAL EXAMINATION:  Vital Signs:  Her blood pressure is 155/97, pulse  96, respirations 18, sats were 99%.  Head, Eyes, Ears, Nose and Throat:  Unremarkable.  Neck:  Supple without thyromegaly.  There is no  supraclavicular or axillary adenopathy.  Chest:  Clear to auscultation  and fashion.  Heart:  Regular sinus rhythm.  No murmurs.  Abdomen:  Soft.  There is no hepatosplenomegaly.  There is surgical  scars.  Also  on her chest, there is a 1-cm area in her left anterior chest.  Therefore, she had a cyst removed.  Pulses are 2+.  There is no clubbing  or edema.  Neurologic:  He is oriented x3.  Sensory and motor.  Cranial  nerves intact.  Since this is only 5 mm and only have an uptake on PET  of 1.3, I think we can safely watch this.  I am somewhat concerned that  this may be an early mets but again given her this is a fairly early  stage lung cancer, I think we can follow this easily.  I am planning to  see her back again in 3 months.  If the nodule is still there or is  increased in size then we will plan to excise his thoracoscopically.  We  will use electromagnetic navigation bronchoscopy to localize the lesion  to be able to do it with a small thoracoscopic incision.   I appreciate the opportunity of seeing the patient.   Sincerely,   Ines Bloomer, M.D.  Electronically Signed   DPB/MEDQ  D:  07/18/2010  T:  07/19/2010  Job:  [id number]

## 2011-04-03 ENCOUNTER — Ambulatory Visit (INDEPENDENT_AMBULATORY_CARE_PROVIDER_SITE_OTHER): Payer: Medicare Other | Admitting: Thoracic Surgery

## 2011-04-03 ENCOUNTER — Ambulatory Visit
Admission: RE | Admit: 2011-04-03 | Discharge: 2011-04-03 | Disposition: A | Discharge status: Home / Self Care | Payer: Medicare Other | Source: Ambulatory Visit | Attending: Thoracic Surgery | Admitting: Thoracic Surgery

## 2011-04-03 ENCOUNTER — Ambulatory Visit: Payer: Medicare Other | Admitting: Thoracic Surgery

## 2011-04-03 DIAGNOSIS — D381 Neoplasm of uncertain behavior of trachea, bronchus and lung: Secondary | ICD-10-CM

## 2011-04-03 DIAGNOSIS — R911 Solitary pulmonary nodule: Secondary | ICD-10-CM

## 2011-04-04 NOTE — Assessment & Plan Note (Signed)
OFFICE VISIT  [name], BATT DOB:  Nov 11, 1946                                        Apr 03, 2011 CHART #:  [id number]  The patient came today for followup of her right upper lobe nodule and it is definitely shrunk from what it was before just being barely visible.  It was previously was 6-mm and is now less than that and this was thought to be possibly a subpleural lymph node.  Given that it is shrunk, we will not need to see her any more.  I will be happy to see her again if she has any other chest problems.  Her blood pressure is 148/92, pulse 72, respirations 20 and sats were 100%.  Ines Bloomer, M.D. Electronically Signed  DPB/MEDQ  D:  04/03/2011  T:  04/04/2011  Job:  [id number]  cc:   Laurice Record, M.D. Larina Earthly, M.D.

## 2011-04-09 ENCOUNTER — Ambulatory Visit: Payer: Medicare Other | Admitting: Thoracic Surgery

## 2011-04-09 ENCOUNTER — Encounter (INDEPENDENT_AMBULATORY_CARE_PROVIDER_SITE_OTHER): Payer: Self-pay | Admitting: General Surgery

## 2011-05-01 ENCOUNTER — Ambulatory Visit: Payer: Medicare Other | Admitting: Thoracic Surgery

## 2011-06-03 ENCOUNTER — Encounter (INDEPENDENT_AMBULATORY_CARE_PROVIDER_SITE_OTHER): Payer: Medicare Other | Admitting: General Surgery

## 2011-06-11 ENCOUNTER — Ambulatory Visit (INDEPENDENT_AMBULATORY_CARE_PROVIDER_SITE_OTHER): Payer: Medicare Other | Admitting: General Surgery

## 2011-06-24 ENCOUNTER — Ambulatory Visit (INDEPENDENT_AMBULATORY_CARE_PROVIDER_SITE_OTHER): Payer: Medicare Other | Admitting: General Surgery

## 2011-06-24 ENCOUNTER — Encounter (INDEPENDENT_AMBULATORY_CARE_PROVIDER_SITE_OTHER): Payer: Self-pay | Admitting: General Surgery

## 2011-06-24 VITALS — Wt 124.6 lb

## 2011-06-24 DIAGNOSIS — K432 Incisional hernia without obstruction or gangrene: Secondary | ICD-10-CM

## 2011-06-24 DIAGNOSIS — C182 Malignant neoplasm of ascending colon: Secondary | ICD-10-CM

## 2011-06-24 NOTE — Progress Notes (Signed)
Subjective:     Patient ID: [name], female   DOB: Oct 01, 1946, 65 y.o.   MRN: [id number]  HPI Patient is status post laparoscopic assisted right colectomy on February 27, 2009. She had a pathologic stage T3 N0, invasive colon cancer. All 18 lymph nodes were negative. She is followed by Dr. Dalene Carrow, and Dr. Elsie Amis, and Dr. Larina Earthly  She feels well. She denies abdominal pain or sensation of bulge in her small hernia. Appetite is normal. Bowel function is normal. Weight is stable. She is due for colonoscopy in 2 years. She sees Dr. a bowel movement every 6 months.  Past Medical History  Diagnosis Date  . Cancer     COLON   Current Outpatient Prescriptions  Medication Sig Dispense Refill  . prednisoLONE acetate (PRED FORTE) 1 % ophthalmic suspension Place 1 drop into both eyes 3 (three) times daily.        Marland Kitchen FOLIC ACID PO Take by mouth.        . Methotrexate Sodium (METHOTREXATE PO) Take by mouth.         No Known Allergies  Family History  Problem Relation Age of Onset  . Hypertension Mother   . Stroke Father   . Hypertension Brother     History  Substance Use Topics  . Smoking status: Current Everyday Smoker -- 0.2 packs/day  . Smokeless tobacco: Not on file  . Alcohol Use: No     Review of Systems 10 system review of systems is performed and is negative except as described above.    Objective:   Physical Exam Ms. Ahlgren looks well she is in no distress. In good spirits  Neck no adenopathy no mass no jugular venous tension.  Lungs clear to auscultation, no chest tenderness.  Heart regular rate and rhythm no murmur.  Abdomen examined supine and standing. Soft and nontender. Liver and spleen not enlarged. Incision is well healed except for just above the umbilicus there is a small ventral incisional hernia which is soft, nontender, and reducible. The defect is probably less than 3 cm. There is no inguinal adenopathy.    Assessment:     Adenocarcinoma of the  right colon, pathologic stage TIII, N0. No evidence of recurrence 2 years iand3 months following laparoscopic-assisted right colectomy.  Small, asymptomatic ventral incisional hernia, supraumbilical.    Plan:     I discussed her ventral hernia with her. She was given patient information booklets with drawings and diagrams. She does not want to consider any repair this at this time, but will return to see me if she develops any pain or enlargement.  She will continue to followup with Dr. Dalene Carrow regarding medical oncology issues, and Dr. Elsie Amis regarding colonoscopy.   followup with Dr. Felipa Eth  regarding general medical issues.  Return to see me p.r.n.

## 2011-06-24 NOTE — Patient Instructions (Signed)
You are doing well. You have a small incisional hernia above the umbilicus, but it is not causing any pain, and has not enlarged over the past year. If this becomes painful or enlarged you should return to see me to consider surgical repair. Keep your regularly scheduled appointments with Dr. Felipa Eth,  Dr. Dalene Carrow, and Elsie Amis. Get colonoscopies periodically. Return to see me as needed.

## 2011-07-29 ENCOUNTER — Other Ambulatory Visit (HOSPITAL_COMMUNITY): Payer: Medicare Other

## 2011-10-29 ENCOUNTER — Telehealth: Payer: Self-pay | Admitting: Hematology and Oncology

## 2011-10-29 ENCOUNTER — Other Ambulatory Visit (HOSPITAL_BASED_OUTPATIENT_CLINIC_OR_DEPARTMENT_OTHER): Payer: Medicare Other | Admitting: Lab

## 2011-10-29 ENCOUNTER — Other Ambulatory Visit: Payer: Self-pay | Admitting: Hematology and Oncology

## 2011-10-29 ENCOUNTER — Ambulatory Visit (HOSPITAL_BASED_OUTPATIENT_CLINIC_OR_DEPARTMENT_OTHER): Payer: Medicare Other | Admitting: Physician Assistant

## 2011-10-29 VITALS — BP 148/85 | HR 75 | Temp 97.9°F | Ht 65.0 in | Wt 132.1 lb

## 2011-10-29 DIAGNOSIS — C189 Malignant neoplasm of colon, unspecified: Secondary | ICD-10-CM | POA: Insufficient documentation

## 2011-10-29 DIAGNOSIS — C182 Malignant neoplasm of ascending colon: Secondary | ICD-10-CM

## 2011-10-29 LAB — CBC WITH DIFFERENTIAL/PLATELET
BASO%: 0.2 % (ref 0.0–2.0)
Basophils Absolute: 0 10*3/uL (ref 0.0–0.1)
EOS%: 0.9 % (ref 0.0–7.0)
Eosinophils Absolute: 0.1 10*3/uL (ref 0.0–0.5)
HCT: 39.3 % (ref 34.8–46.6)
HGB: 13.6 g/dL (ref 11.6–15.9)
LYMPH%: 38.1 % (ref 14.0–49.7)
MCH: 33.1 pg (ref 25.1–34.0)
MCHC: 34.6 g/dL (ref 31.5–36.0)
MCV: 95.6 fL (ref 79.5–101.0)
MONO#: 0.4 10*3/uL (ref 0.1–0.9)
MONO%: 7.1 % (ref 0.0–14.0)
NEUT#: 3.1 10*3/uL (ref 1.5–6.5)
NEUT%: 53.7 % (ref 38.4–76.8)
Platelets: 177 10*3/uL (ref 145–400)
RBC: 4.11 10*6/uL (ref 3.70–5.45)
RDW: 12.4 % (ref 11.2–14.5)
WBC: 5.7 10*3/uL (ref 3.9–10.3)
lymph#: 2.2 10*3/uL (ref 0.9–3.3)
nRBC: 0 % (ref 0–0)

## 2011-10-29 LAB — COMPREHENSIVE METABOLIC PANEL
ALT: 11 U/L (ref 0–35)
AST: 14 U/L (ref 0–37)
Albumin: 4.2 g/dL (ref 3.5–5.2)
Alkaline Phosphatase: 43 U/L (ref 39–117)
BUN: 12 mg/dL (ref 6–23)
CO2: 22 mEq/L (ref 19–32)
Calcium: 9.5 mg/dL (ref 8.4–10.5)
Chloride: 106 mEq/L (ref 96–112)
Creatinine, Ser: 0.77 mg/dL (ref 0.50–1.10)
Glucose, Bld: 87 mg/dL (ref 70–99)
Potassium: 3.9 mEq/L (ref 3.5–5.3)
Sodium: 140 mEq/L (ref 135–145)
Total Bilirubin: 0.7 mg/dL (ref 0.3–1.2)
Total Protein: 7 g/dL (ref 6.0–8.3)

## 2011-10-29 LAB — CEA: CEA: 1.2 ng/mL (ref 0.0–5.0)

## 2011-10-29 NOTE — Progress Notes (Signed)
CC:   [name]. [name], M.D. Anselmo Rod, MD, Melodye Ped D. Polite, M.D.  IDENTIFYING STATEMENT:  [name] is a 65 year old black female with history of T3 N0 M0, stage IIA, moderately differentiated adenocarcinoma of the colon, who presents for followup.  INTERIM HISTORY:  [name] reports since her last clinic visit in March 2012 that she was evaluated by Dr. Claud Kelp in August 2012 for evaluation of a ventral hernia.  She states she was not having any symptoms at that time and did not want to consider any type of surgical repair.  She states she still is not having any issues with abdominal discomfort.  No problems with diarrhea or constipation.  No rectal bleeding.  She reports normal appetite and has had no issues with fevers, chills, or night sweats.  She also has a normal energy level. She has had no issues with dyspnea or cough.  She has had no dysuria, frequency, or hematuria.  No alteration in sensation or balance, or swelling of extremities.  She does report that she had a cornea repair at the end of November 2012 and was last evaluated last week, and she plans to have an eyelid lift her eye heals from her cornea repair.  She has had no changes in medications and they are reviewed and recorded.  PHYSICAL EXAM:  Vital Signs:  Temperature is 97.9, heart rate 75, respirations 18, blood pressure is 148/85.  Weight 132.1 pounds. General:  Well-developed, well-nourished, black female, in no acute distress.  HEENT:  Sclerae nonicteric.  There is no oral thrush or mucositis.  Skin:  Without rashes or lesions.  Lymph:  No cervical, supraclavicular, axillary, or inguinal lymphadenopathy.  Cardiac: Regular rate and rhythm without murmurs or gallops.  Peripheral pulses are 2+.  Chest:  Lungs clear to auscultation.  Abdomen:  Positive bowel sounds.  Soft, nontender, nondistended.  No organomegaly.  Extremities: Without edema, cyanosis, or calf tenderness.  Neuro:   Alert and oriented x3.  Strength, sensation, and coordination all grossly intact.  LABS:  CBC with diff reveals white blood count of 5.7, hemoglobin 13.6, hematocrit of 39.3, platelets of 177, ANC of 3.1, and MCV of 95.6.  CMET and CEA are currently pending.  IMPRESSION/PLAN: 1. [name] is a 65 year old black female with history of T3 N0     M0, stage IIA, moderately differentiated adenocarcinoma of the     colon.  She is status post laparoscopic-assisted right colectomy     with primary anastomosis on February 27, 2009.  She was noted to have     a new pulmonary nodule in the right middle lobe concerning for lung     metastasis on July 02, 2010, and was referred to Dr. Cameron Proud for further evaluation.  She last had a CT of the     chest without contrast on Apr 03, 2011, which revealed a small     nodule along the minor fissure.  It is smaller than from July 09, 2010, and may represent a subpleural lymph node.  There was mildly     aneurysmal ascending aorta and coronary artery calcification.  The     patient states she was last evaluated by Dr. Edwyna Shell on Apr 03, 2011, and at that time she was advised that she would follow up     with Dr. Edwyna Shell on a p.r.n. basis only. 2. We had planned for  the patient to have repeat CT scan of the chest,     abdomen, and pelvis in September 2012 as the patient was to have a     68-month followup from her last clinic visit.  However, the patient     actually called on Apr 12, 2011, to cancel her appointment stating     that she had been seen by Dr. Edwyna Shell in May.  The patient then     called in August and wanted to schedule a followup for December,     and is seen today.  However, she did not have CT scans.  The     patient states that she does not want to have scans at this time     and prefers to wait until her followup visit with Dr. Dalene Carrow which     is going to be scheduled 9 months from now.  Therefore, per Dr.      Dalene Carrow, we will go ahead and schedule her for followup in September     2013.  A few days before this, we will reassess CBC with diff,     CMET, and CEA as well as CT of the chest, abdomen, and pelvis with     contrast.  The patient was advised to call in the interim if any     questions or problems. 3. Of note, the patient's last mammogram was December 24, 2010, which     revealed no evidence of malignancy.  Her last colonoscopy was     spring of 2011.    ______________________________ Emily Banker, MSN, ANP, BC RJ/MEDQ  D:  10/29/2011  T:  10/29/2011  Job:  [id number]

## 2011-10-29 NOTE — Progress Notes (Signed)
This office note has been dictated.

## 2011-10-29 NOTE — Telephone Encounter (Signed)
gve the pt her sept 2013 appt calendar along with the contrast.

## 2011-11-26 ENCOUNTER — Other Ambulatory Visit: Payer: Self-pay | Admitting: Internal Medicine

## 2011-11-26 DIAGNOSIS — Z1231 Encounter for screening mammogram for malignant neoplasm of breast: Secondary | ICD-10-CM

## 2011-12-23 ENCOUNTER — Ambulatory Visit: Payer: Medicare Other

## 2011-12-24 ENCOUNTER — Ambulatory Visit
Admission: RE | Admit: 2011-12-24 | Discharge: 2011-12-24 | Disposition: A | Discharge status: Home / Self Care | Payer: Medicare Other | Source: Ambulatory Visit | Attending: Internal Medicine | Admitting: Internal Medicine

## 2011-12-24 DIAGNOSIS — Z1231 Encounter for screening mammogram for malignant neoplasm of breast: Secondary | ICD-10-CM

## 2011-12-31 ENCOUNTER — Other Ambulatory Visit: Payer: Self-pay | Admitting: Internal Medicine

## 2011-12-31 DIAGNOSIS — R928 Other abnormal and inconclusive findings on diagnostic imaging of breast: Secondary | ICD-10-CM

## 2012-01-07 ENCOUNTER — Ambulatory Visit
Admission: RE | Admit: 2012-01-07 | Discharge: 2012-01-07 | Disposition: A | Discharge status: Home / Self Care | Payer: Medicare Other | Source: Ambulatory Visit | Attending: Internal Medicine | Admitting: Internal Medicine

## 2012-01-07 DIAGNOSIS — R928 Other abnormal and inconclusive findings on diagnostic imaging of breast: Secondary | ICD-10-CM

## 2012-07-23 ENCOUNTER — Telehealth: Payer: Self-pay

## 2012-07-23 NOTE — Telephone Encounter (Signed)
Returned pt call with appt times and places

## 2012-07-28 ENCOUNTER — Ambulatory Visit (HOSPITAL_COMMUNITY)
Admission: RE | Admit: 2012-07-28 | Discharge: 2012-07-28 | Disposition: A | Discharge status: Home / Self Care | Payer: Medicare Other | Source: Ambulatory Visit | Attending: Physician Assistant | Admitting: Physician Assistant

## 2012-07-28 ENCOUNTER — Other Ambulatory Visit (HOSPITAL_BASED_OUTPATIENT_CLINIC_OR_DEPARTMENT_OTHER): Payer: Medicare Other | Admitting: Lab

## 2012-07-28 DIAGNOSIS — E042 Nontoxic multinodular goiter: Secondary | ICD-10-CM | POA: Insufficient documentation

## 2012-07-28 DIAGNOSIS — I77810 Thoracic aortic ectasia: Secondary | ICD-10-CM | POA: Insufficient documentation

## 2012-07-28 DIAGNOSIS — C189 Malignant neoplasm of colon, unspecified: Secondary | ICD-10-CM | POA: Insufficient documentation

## 2012-07-28 DIAGNOSIS — Z9049 Acquired absence of other specified parts of digestive tract: Secondary | ICD-10-CM | POA: Insufficient documentation

## 2012-07-28 DIAGNOSIS — K7689 Other specified diseases of liver: Secondary | ICD-10-CM | POA: Insufficient documentation

## 2012-07-28 DIAGNOSIS — Q619 Cystic kidney disease, unspecified: Secondary | ICD-10-CM | POA: Insufficient documentation

## 2012-07-28 LAB — CBC WITH DIFFERENTIAL/PLATELET
BASO%: 0.4 % (ref 0.0–2.0)
Basophils Absolute: 0 10*3/uL (ref 0.0–0.1)
EOS%: 0.7 % (ref 0.0–7.0)
Eosinophils Absolute: 0 10*3/uL (ref 0.0–0.5)
HCT: 39 % (ref 34.8–46.6)
HGB: 13.3 g/dL (ref 11.6–15.9)
LYMPH%: 41 % (ref 14.0–49.7)
MCH: 34 pg (ref 25.1–34.0)
MCHC: 34.1 g/dL (ref 31.5–36.0)
MCV: 99.9 fL (ref 79.5–101.0)
MONO#: 0.4 10*3/uL (ref 0.1–0.9)
MONO%: 9.3 % (ref 0.0–14.0)
NEUT#: 2.3 10*3/uL (ref 1.5–6.5)
NEUT%: 48.6 % (ref 38.4–76.8)
Platelets: 157 10*3/uL (ref 145–400)
RBC: 3.9 10*6/uL (ref 3.70–5.45)
RDW: 12.9 % (ref 11.2–14.5)
WBC: 4.6 10*3/uL (ref 3.9–10.3)
lymph#: 1.9 10*3/uL (ref 0.9–3.3)

## 2012-07-28 LAB — COMPREHENSIVE METABOLIC PANEL (CC13)
ALT: 13 U/L (ref 0–55)
AST: 14 U/L (ref 5–34)
Albumin: 3.6 g/dL (ref 3.5–5.0)
Alkaline Phosphatase: 50 U/L (ref 40–150)
BUN: 10 mg/dL (ref 7.0–26.0)
CO2: 25 mEq/L (ref 22–29)
Calcium: 9.8 mg/dL (ref 8.4–10.4)
Chloride: 105 mEq/L (ref 98–107)
Creatinine: 0.9 mg/dL (ref 0.6–1.1)
Glucose: 86 mg/dl (ref 70–99)
Potassium: 3.9 mEq/L (ref 3.5–5.1)
Sodium: 140 mEq/L (ref 136–145)
Total Bilirubin: 0.4 mg/dL (ref 0.20–1.20)
Total Protein: 7.4 g/dL (ref 6.4–8.3)

## 2012-07-28 LAB — CEA: CEA: 1.9 ng/mL (ref 0.0–5.0)

## 2012-07-28 MED ORDER — IOHEXOL 300 MG/ML  SOLN
100.0000 mL | Freq: Once | INTRAMUSCULAR | Status: AC | PRN
  Administered 2012-07-28: 100 mL via INTRAVENOUS

## 2012-07-31 ENCOUNTER — Telehealth: Payer: Self-pay | Admitting: Hematology and Oncology

## 2012-07-31 ENCOUNTER — Ambulatory Visit (HOSPITAL_BASED_OUTPATIENT_CLINIC_OR_DEPARTMENT_OTHER): Payer: Medicare Other | Admitting: Hematology and Oncology

## 2012-07-31 ENCOUNTER — Encounter: Payer: Self-pay | Admitting: Hematology and Oncology

## 2012-07-31 VITALS — BP 148/92 | HR 70 | Temp 97.3°F | Resp 20 | Ht 65.0 in | Wt 128.0 lb

## 2012-07-31 DIAGNOSIS — C182 Malignant neoplasm of ascending colon: Secondary | ICD-10-CM

## 2012-07-31 DIAGNOSIS — C189 Malignant neoplasm of colon, unspecified: Secondary | ICD-10-CM

## 2012-07-31 DIAGNOSIS — E041 Nontoxic single thyroid nodule: Secondary | ICD-10-CM

## 2012-07-31 NOTE — Progress Notes (Signed)
This office note has been dictated.

## 2012-07-31 NOTE — Progress Notes (Signed)
Faxed pt CT scan to Dr. Felipa Eth.

## 2012-07-31 NOTE — Patient Instructions (Addendum)
[name]  [id number]   [facility] - AFTER VISIT SUMMARY   **RECOMMENDATIONS MADE BY THE CONSULTANT AND ANY TEST    RESULTS WILL BE SENT TO YOUR REFERRING DOCTORS.   YOUR EXAM FINDINGS, LABS AND RESULTS WERE DISCUSSED BY YOUR MD TODAY.    Your Updated drug allergies are: Allergies as of 07/31/2012  . (No Known Allergies)    Your current list of medications are: Current Outpatient Prescriptions  Medication Sig Dispense Refill  . Ascorbic Acid (VITAMIN C) 100 MG tablet Take 100 mg by mouth daily.      . calcium carbonate (OS-CAL) 600 MG TABS Take 1,200 mg by mouth 2 (two) times daily with a meal.      . fish oil-omega-3 fatty acids 1000 MG capsule Take 2 g by mouth daily.      . Garlic 10 MG CAPS Take 1 tablet by mouth 2 (two) times daily.      . Multiple Vitamins-Minerals (EYE VITAMINS PO) Take 1 tablet by mouth.      . Multiple Vitamins-Minerals (HAIR VITAMINS PO) Take 1 tablet by mouth as needed.      . prednisoLONE acetate (PRED FORTE) 1 % ophthalmic suspension Place 1 drop into both eyes 3 (three) times daily.        . Vitamin D, Ergocalciferol, (DRISDOL) 50000 UNITS CAPS Take 50,000 Units by mouth.         INSTRUCTIONS GIVEN AND DISCUSSED:  See attached schedule   SPECIAL INSTRUCTIONS/FOLLOW-UP:  See above.  I acknowledge that I have been informed and understand all the instructions given to me and received a copy. I do not have any more questions at this time, but understand that I may call the [facility] at [phone] during business hours should I have any further questions or need assistance in obtaining follow-up care.

## 2012-07-31 NOTE — Telephone Encounter (Signed)
Gave pt appt for June 2014 lab a few days before ML

## 2012-08-03 NOTE — Progress Notes (Signed)
CC:   [name]. Derrell Lolling, M.D. Anselmo Rod, MD, Michelle Piper, M.D.  IDENTIFYING STATEMENT:  The patient is a 66 year old woman with colon cancer who presents for followup.  INTERVAL HISTORY:  [name] reports no concerns since her last visit 9 months ago.  Has had no change in bowel function.  Feels well.  Has no nausea, vomiting, abdominal pain.  Denies rectal bleeding.  Reviewed CT of the chest, abdomen, and pelvis on 07/28/2012.  The CT showed multiple thyroid nodules with no evidence of metastatic disease within the chest. Likewise, the abdomen showed small stable liver cysts with no new suspicious liver masses or lesions.  There was no abdominal or pelvic adenopathy.  MEDICATIONS:  Reviewed and updated.  ALLERGIES:  None.  PAST MEDICAL HISTORY/FAMILY HISTORY/SOCIAL HISTORY:  Unchanged.  REVIEW OF SYSTEMS:  A 10-point review of systems negative.  PHYSICAL EXAMINATION:  General:  The patient is a well-appearing, well- nourished woman in no distress.  Vitals:  Pulse 70, blood pressure 148/92, temperature 97.3, respirations 20, weight 128 pounds.  HEENT: Head is atraumatic, normocephalic.  Sclerae are anicteric.  Mouth moist. Neck:  Supple.  Chest:  Clear.  CVS:  Unremarkable.  Abdomen:  Soft, nontender.  Bowel sounds present.  Extremities:  No edema.  LABORATORY DATA:  White cell count 4.6, hemoglobin 13.3, hematocrit 39, platelets 157.  Sodium 140, potassium 3.9, chloride 105, CO2 is 25, BUN 10, creatinine 0.9, glucose 86.  T-bili 0.4, alkaline phosphatase 50, AST 14, ALT 13.  CEA 1.9.  Results of CTs as in interval history.  IMPRESSION AND PLAN:  [name] is a 66 year old woman with a stage IIA, T3 N0 moderately-differentiated adenocarcinoma of the colon. Status post laparoscopic-assisted right colectomy on February 27, 2009. Her current CTs indicate no evidence of recurrence.   She informs me that Dr. Loreta Ave will be performing a colonoscopy in May 2014.  CT scans  note thyroid nodules.  I have  asked the patient to follow up with Dr. Felipa Eth for evaluation. Results will be  faxed to his office. She follows up in 9 months' time with labs only.    ______________________________ Laurice Record, M.D. LIO/MEDQ  D:  07/31/2012  T:  07/31/2012  Job:  [id number]

## 2012-11-10 ENCOUNTER — Encounter (HOSPITAL_COMMUNITY): Payer: Self-pay | Admitting: *Deleted

## 2012-11-10 ENCOUNTER — Emergency Department (HOSPITAL_COMMUNITY)
Admission: EM | Admit: 2012-11-10 | Discharge: 2012-11-10 | Disposition: A | Discharge status: Home / Self Care | Payer: No Typology Code available for payment source | Attending: Emergency Medicine | Admitting: Emergency Medicine

## 2012-11-10 DIAGNOSIS — Z79899 Other long term (current) drug therapy: Secondary | ICD-10-CM | POA: Insufficient documentation

## 2012-11-10 DIAGNOSIS — Y9389 Activity, other specified: Secondary | ICD-10-CM | POA: Insufficient documentation

## 2012-11-10 DIAGNOSIS — T148XXA Other injury of unspecified body region, initial encounter: Secondary | ICD-10-CM

## 2012-11-10 DIAGNOSIS — Z85038 Personal history of other malignant neoplasm of large intestine: Secondary | ICD-10-CM | POA: Insufficient documentation

## 2012-11-10 DIAGNOSIS — Y9289 Other specified places as the place of occurrence of the external cause: Secondary | ICD-10-CM | POA: Insufficient documentation

## 2012-11-10 DIAGNOSIS — F17200 Nicotine dependence, unspecified, uncomplicated: Secondary | ICD-10-CM | POA: Insufficient documentation

## 2012-11-10 MED ORDER — NAPROXEN 500 MG PO TABS: 500.0000 mg | ORAL_TABLET | Freq: Two times a day (BID) | ORAL | Status: DC

## 2012-11-10 MED ORDER — CYCLOBENZAPRINE HCL 10 MG PO TABS: 10.0000 mg | ORAL_TABLET | Freq: Three times a day (TID) | ORAL | Status: DC | PRN

## 2012-11-10 MED ORDER — HYDROCODONE-ACETAMINOPHEN 5-325 MG PO TABS
1.0000 | ORAL_TABLET | Freq: Once | ORAL | Status: AC
  Administered 2012-11-10: 1 via ORAL
  Filled 2012-11-10: qty 1

## 2012-11-10 NOTE — ED Provider Notes (Signed)
Medical screening examination/treatment/procedure(s) were performed by non-physician practitioner and as supervising physician I was immediately available for consultation/collaboration.   Celene Kras, MD 11/10/12 2222
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
## 2012-11-10 NOTE — ED Notes (Signed)
Discharge instructions reviewed. Rx given x2.  

## 2012-12-10 ENCOUNTER — Other Ambulatory Visit: Payer: Self-pay | Admitting: Internal Medicine

## 2012-12-10 DIAGNOSIS — Z1231 Encounter for screening mammogram for malignant neoplasm of breast: Secondary | ICD-10-CM

## 2013-01-13 ENCOUNTER — Ambulatory Visit
Admission: RE | Admit: 2013-01-13 | Discharge: 2013-01-13 | Disposition: A | Discharge status: Home / Self Care | Payer: Medicare Other | Source: Ambulatory Visit | Attending: Internal Medicine | Admitting: Internal Medicine

## 2013-01-13 DIAGNOSIS — Z1231 Encounter for screening mammogram for malignant neoplasm of breast: Secondary | ICD-10-CM

## 2013-01-19 ENCOUNTER — Telehealth: Payer: Self-pay | Admitting: Oncology

## 2013-01-19 ENCOUNTER — Encounter: Payer: Self-pay | Admitting: Oncology

## 2013-01-19 NOTE — Telephone Encounter (Signed)
sw pt appt d/t was given.Marland Kitchenalso pt aware of letter/cal being mailed to inform her of the reassignment in doctors...td

## 2013-01-21 ENCOUNTER — Telehealth: Payer: Self-pay | Admitting: Oncology

## 2013-04-23 ENCOUNTER — Other Ambulatory Visit: Payer: Medicare Other | Admitting: Lab

## 2013-04-28 ENCOUNTER — Ambulatory Visit: Payer: Medicare Other | Admitting: Family

## 2013-04-28 ENCOUNTER — Telehealth: Payer: Self-pay | Admitting: Oncology

## 2013-04-28 ENCOUNTER — Other Ambulatory Visit (HOSPITAL_BASED_OUTPATIENT_CLINIC_OR_DEPARTMENT_OTHER): Payer: Medicare Other | Admitting: Lab

## 2013-04-28 ENCOUNTER — Ambulatory Visit (HOSPITAL_BASED_OUTPATIENT_CLINIC_OR_DEPARTMENT_OTHER): Payer: Medicare Other | Admitting: Oncology

## 2013-04-28 VITALS — BP 146/85 | HR 92 | Temp 97.1°F | Resp 18 | Ht 65.0 in | Wt 129.9 lb

## 2013-04-28 DIAGNOSIS — C182 Malignant neoplasm of ascending colon: Secondary | ICD-10-CM

## 2013-04-28 DIAGNOSIS — C189 Malignant neoplasm of colon, unspecified: Secondary | ICD-10-CM

## 2013-04-28 LAB — CEA: CEA: 1.7 ng/mL (ref 0.0–5.0)

## 2013-04-28 LAB — CBC WITH DIFFERENTIAL/PLATELET
BASO%: 0.9 % (ref 0.0–2.0)
Basophils Absolute: 0.1 10*3/uL (ref 0.0–0.1)
EOS%: 0.2 % (ref 0.0–7.0)
Eosinophils Absolute: 0 10*3/uL (ref 0.0–0.5)
HCT: 39.3 % (ref 34.8–46.6)
HGB: 13.4 g/dL (ref 11.6–15.9)
LYMPH%: 31.4 % (ref 14.0–49.7)
MCH: 33.7 pg (ref 25.1–34.0)
MCHC: 34.1 g/dL (ref 31.5–36.0)
MCV: 98.9 fL (ref 79.5–101.0)
MONO#: 0.4 10*3/uL (ref 0.1–0.9)
MONO%: 7.4 % (ref 0.0–14.0)
NEUT#: 3.6 10*3/uL (ref 1.5–6.5)
NEUT%: 60.1 % (ref 38.4–76.8)
Platelets: 194 10*3/uL (ref 145–400)
RBC: 3.98 10*6/uL (ref 3.70–5.45)
RDW: 12.7 % (ref 11.2–14.5)
WBC: 6 10*3/uL (ref 3.9–10.3)
lymph#: 1.9 10*3/uL (ref 0.9–3.3)

## 2013-04-28 LAB — COMPREHENSIVE METABOLIC PANEL (CC13)
ALT: 14 U/L (ref 0–55)
AST: 14 U/L (ref 5–34)
Albumin: 3.8 g/dL (ref 3.5–5.0)
Alkaline Phosphatase: 59 U/L (ref 40–150)
BUN: 7.5 mg/dL (ref 7.0–26.0)
CO2: 24 mEq/L (ref 22–29)
Calcium: 9.6 mg/dL (ref 8.4–10.4)
Chloride: 106 mEq/L (ref 98–107)
Creatinine: 0.8 mg/dL (ref 0.6–1.1)
Glucose: 84 mg/dl (ref 70–99)
Potassium: 3.9 mEq/L (ref 3.5–5.1)
Sodium: 139 mEq/L (ref 136–145)
Total Bilirubin: 1.13 mg/dL (ref 0.20–1.20)
Total Protein: 7.9 g/dL (ref 6.4–8.3)

## 2013-04-28 NOTE — Progress Notes (Signed)
Hematology and Oncology Follow Up Visit  [name] [id number] January 15, 1946 67 y.o. 04/28/2013 3:20 PM   Principle Diagnosis: 67 year old woman diagnosed with T3 N0 moderately differentiated adenocarcinoma of the colon diagnosed in 2010.   Prior Therapy: She is status post laparoscopic assisted right colectomy done on 02/27/2009 with the pathology revealed stage IIa disease. No adjuvant chemotherapy needed.  Current therapy: Observation and surveillance.  Interim History:  [name] presents today for a followup visit. He is a very pleasant woman with a history of colon cancer dating back to 2010. Her surveillance CT scans as well as colonoscopies did not show any evidence of relapsed disease she is presenting today for a clinical visit. She had not reported any abdominal pain or change in her bowel habits. She had not reported any weakness fatigue or tiredness. Has not had any weight loss or major changes in her activity level. She's continued to have excellent performance status without any findings to suggest recurrent colon cancer.  Medications: I have reviewed the patient's current medications.  Current Outpatient Prescriptions  Medication Sig Dispense Refill  . Ascorbic Acid (VITAMIN C) 100 MG tablet Take 100 mg by mouth daily.      . calcium carbonate (OS-CAL) 600 MG TABS Take 1,200 mg by mouth 2 (two) times daily with a meal.      . fish oil-omega-3 fatty acids 1000 MG capsule Take 2 g by mouth daily.      . Garlic 10 MG CAPS Take 1 tablet by mouth 2 (two) times daily.      . Multiple Vitamins-Minerals (EYE VITAMINS PO) Take 1 tablet by mouth.      . Multiple Vitamins-Minerals (HAIR VITAMINS PO) Take 1 tablet by mouth as needed.      . prednisoLONE acetate (PRED FORTE) 1 % ophthalmic suspension Place 1 drop into both eyes 3 (three) times daily.        . Vitamin D, Ergocalciferol, (DRISDOL) 50000 UNITS CAPS Take 50,000 Units by mouth every 7 (seven) days.        No current  facility-administered medications for this visit.     Allergies: No Known Allergies  Past Medical History, Surgical history, Social history, and Family History were reviewed and updated.  Review of Systems: Constitutional:  Negative for fever, chills, night sweats, anorexia, weight loss, pain. Cardiovascular: no chest pain or dyspnea on exertion Respiratory: negative Neurological: negative Dermatological: negative ENT: negative Skin: Negative. Gastrointestinal: negative Genito-Urinary: negative Hematological and Lymphatic: negative Breast: negative Musculoskeletal: negative Remaining ROS negative. Physical Exam: Blood pressure 146/85, pulse 92, temperature 97.1 F (36.2 C), temperature source Oral, resp. rate 18, height 5\' 5"  (1.651 m), weight 129 lb 14.4 oz (58.922 kg). ECOG: 0 General appearance: alert Head: Normocephalic, without obvious abnormality, atraumatic Neck: no adenopathy, no carotid bruit, no JVD, supple, symmetrical, trachea midline and thyroid not enlarged, symmetric, no tenderness/mass/nodules Lymph nodes: Cervical, supraclavicular, and axillary nodes normal. Heart:regular rate and rhythm, S1, S2 normal, no murmur, click, rub or gallop Lung:chest clear, no wheezing, rales, normal symmetric air entry Abdomin: soft, non-tender, without masses or organomegaly EXT:no erythema, induration, or nodules   Lab Results: Lab Results  Component Value Date   WBC 6.0 04/28/2013   HGB 13.4 04/28/2013   HCT 39.3 04/28/2013   MCV 98.9 04/28/2013   PLT 194 04/28/2013     Chemistry      Component Value Date/Time   NA 140 07/28/2012 0953   NA 140 10/29/2011 0953   K 3.9  07/28/2012 0953   K 3.9 10/29/2011 0953   CL 105 07/28/2012 0953   CL 106 10/29/2011 0953   CO2 25 07/28/2012 0953   CO2 22 10/29/2011 0953   BUN 10.0 07/28/2012 0953   BUN 12 10/29/2011 0953   CREATININE 0.9 07/28/2012 0953   CREATININE 0.77 10/29/2011 0953      Component Value Date/Time   CALCIUM 9.8  07/28/2012 0953   CALCIUM 9.5 10/29/2011 0953   ALKPHOS 50 07/28/2012 0953   ALKPHOS 43 10/29/2011 0953   AST 14 07/28/2012 0953   AST 14 10/29/2011 0953   ALT 13 07/28/2012 0953   ALT 11 10/29/2011 0953   BILITOT 0.40 07/28/2012 0953   BILITOT 0.7 10/29/2011 0953      Impression and Plan:  67 year old woman with the following issues:  1. 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 She is status post laparoscopic right colectomy done on 02/27/2009 and have been disease free since that time. Her laboratory data and physical examination today do not suggest any relapsed disease. Her last CT scan in September of 2013 did not show any evidence of relapse at this time. The plan would be to continue active surveillance and followup at this time it'll be done every 12 months which include physical examination and laboratory data. CT scans will be done on an as-needed basis.  2. Surveillance colonoscopies: She is scheduled to have one in July of 2014 and she is up-to-date from that standpoint.   [facility], MD 6/11/20143:20 PM

## 2013-04-30 ENCOUNTER — Ambulatory Visit: Payer: Self-pay | Admitting: Oncology

## 2013-12-21 ENCOUNTER — Other Ambulatory Visit: Payer: Self-pay

## 2013-12-21 DIAGNOSIS — Z1231 Encounter for screening mammogram for malignant neoplasm of breast: Secondary | ICD-10-CM

## 2014-01-14 ENCOUNTER — Ambulatory Visit: Payer: Medicare Other

## 2014-01-26 ENCOUNTER — Ambulatory Visit
Admission: RE | Admit: 2014-01-26 | Discharge: 2014-01-26 | Disposition: A | Discharge status: Home / Self Care | Payer: Medicare Other | Source: Ambulatory Visit

## 2014-01-26 DIAGNOSIS — Z1231 Encounter for screening mammogram for malignant neoplasm of breast: Secondary | ICD-10-CM

## 2014-04-28 ENCOUNTER — Encounter: Payer: Self-pay | Admitting: Oncology

## 2014-04-28 ENCOUNTER — Other Ambulatory Visit (HOSPITAL_BASED_OUTPATIENT_CLINIC_OR_DEPARTMENT_OTHER): Payer: Medicare Other

## 2014-04-28 ENCOUNTER — Ambulatory Visit (HOSPITAL_BASED_OUTPATIENT_CLINIC_OR_DEPARTMENT_OTHER): Payer: Medicare Other | Admitting: Oncology

## 2014-04-28 VITALS — BP 152/90 | HR 85 | Temp 97.7°F | Resp 18 | Ht 65.0 in | Wt 130.7 lb

## 2014-04-28 DIAGNOSIS — C189 Malignant neoplasm of colon, unspecified: Secondary | ICD-10-CM

## 2014-04-28 DIAGNOSIS — Z85038 Personal history of other malignant neoplasm of large intestine: Secondary | ICD-10-CM

## 2014-04-28 LAB — COMPREHENSIVE METABOLIC PANEL (CC13)
ALT: 14 U/L (ref 0–55)
AST: 15 U/L (ref 5–34)
Albumin: 3.6 g/dL (ref 3.5–5.0)
Alkaline Phosphatase: 60 U/L (ref 40–150)
Anion Gap: 9 mEq/L (ref 3–11)
BUN: 11.3 mg/dL (ref 7.0–26.0)
CO2: 22 mEq/L (ref 22–29)
Calcium: 9.1 mg/dL (ref 8.4–10.4)
Chloride: 110 mEq/L — ABNORMAL HIGH (ref 98–109)
Creatinine: 0.8 mg/dL (ref 0.6–1.1)
Glucose: 94 mg/dl (ref 70–140)
Potassium: 3.9 mEq/L (ref 3.5–5.1)
Sodium: 141 mEq/L (ref 136–145)
Total Bilirubin: 1.03 mg/dL (ref 0.20–1.20)
Total Protein: 7.3 g/dL (ref 6.4–8.3)

## 2014-04-28 LAB — CBC WITH DIFFERENTIAL/PLATELET
BASO%: 0.7 % (ref 0.0–2.0)
Basophils Absolute: 0 10*3/uL (ref 0.0–0.1)
EOS%: 0.5 % (ref 0.0–7.0)
Eosinophils Absolute: 0 10*3/uL (ref 0.0–0.5)
HCT: 40.1 % (ref 34.8–46.6)
HGB: 13.3 g/dL (ref 11.6–15.9)
LYMPH%: 34.7 % (ref 14.0–49.7)
MCH: 33 pg (ref 25.1–34.0)
MCHC: 33.1 g/dL (ref 31.5–36.0)
MCV: 99.6 fL (ref 79.5–101.0)
MONO#: 0.4 10*3/uL (ref 0.1–0.9)
MONO%: 7.5 % (ref 0.0–14.0)
NEUT#: 2.9 10*3/uL (ref 1.5–6.5)
NEUT%: 56.6 % (ref 38.4–76.8)
Platelets: 173 10*3/uL (ref 145–400)
RBC: 4.02 10*6/uL (ref 3.70–5.45)
RDW: 13.1 % (ref 11.2–14.5)
WBC: 5.1 10*3/uL (ref 3.9–10.3)
lymph#: 1.8 10*3/uL (ref 0.9–3.3)

## 2014-04-28 NOTE — Progress Notes (Signed)
Hematology and Oncology Follow Up Visit  [name] [id number] [birth year] 68 y.o. 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   Prior Therapy: She is status post laparoscopic assisted right colectomy done on 02/27/2009 with the pathology revealed stage IIa disease. No adjuvant chemotherapy needed.  Current therapy: Observation and surveillance.  Interim History:  [name] presents today for a followup visit. Since her last visit, she has been doing very well. She has no symptoms to suggest cancer recurrence. She had not reported any abdominal pain or change in her bowel habits. She had not reported any weakness fatigue or tiredness. Has not had any weight loss or major changes in her activity level. She's continued to have excellent performance status without any findings to suggest recurrent colon cancer. She does not report any headaches blurred vision or double vision. She does not report any chest pain or palpitation. Does not report any nausea or vomiting or hematochezia. She has not reported any genitourinary complaints. Is not reporting any skeletal complaints. Rest of her review of systems unremarkable.  Medications: I have reviewed the patient's current medications.  Current Outpatient Prescriptions  Medication Sig Dispense Refill  . Ascorbic Acid (VITAMIN C) 100 MG tablet Take 100 mg by mouth daily.      . calcium carbonate (OS-CAL) 600 MG TABS Take 1,200 mg by mouth 2 (two) times daily with a meal.      . fish oil-omega-3 fatty acids 1000 MG capsule Take 2 g by mouth daily.      . Garlic 10 MG CAPS Take 1 tablet by mouth 2 (two) times daily.      . Multiple Vitamins-Minerals (EYE VITAMINS PO) Take 1 tablet by mouth.      . Multiple Vitamins-Minerals (HAIR VITAMINS PO) Take 1 tablet by mouth as needed.      . prednisoLONE acetate (PRED FORTE) 1 % ophthalmic suspension  Place 1 drop into both eyes 3 (three) times daily.        . Vitamin D, Ergocalciferol, (DRISDOL) 50000 UNITS CAPS Take 50,000 Units by mouth every 7 (seven) days.        No current facility-administered medications for this visit.     Allergies: No Known Allergies  Physical Exam: Blood pressure 152/90, pulse 85, temperature 97.7 F (36.5 C), temperature source Oral, resp. rate 18, height 5\' 5"  (1.651 m), weight 130 lb 11.2 oz (59.285 kg). ECOG: 0 General appearance: alert awake not in any distress. Head: Normocephalic, without obvious abnormality Neck: no adenopathy Lymph nodes: Cervical, supraclavicular, and axillary nodes normal. Heart:regular rate and rhythm, S1, S2 normal, no murmur, click, rub or gallop Lung:chest clear, no wheezing, rales, normal symmetric air entry Abdomin: soft, non-tender, without masses or organomegaly EXT:no erythema, induration, or nodules   Lab Results: Lab Results  Component Value Date   WBC 5.1 04/28/2014   HGB 13.3 04/28/2014   HCT 40.1 04/28/2014   MCV 99.6 04/28/2014   PLT 173 04/28/2014     Chemistry      Component Value Date/Time   NA 139 04/28/2013 1442   NA 140 10/29/2011 0953   K 3.9 04/28/2013 1442   K 3.9 10/29/2011 0953   CL 106 04/28/2013 1442   CL 106 10/29/2011 0953   CO2 24 04/28/2013 1442   CO2 22 10/29/2011 0953   BUN 7.5 04/28/2013 1442   BUN 12 10/29/2011 0953   CREATININE 0.8  04/28/2013 1442   CREATININE 0.77 10/29/2011 0953      Component Value Date/Time   CALCIUM 9.6 04/28/2013 1442   CALCIUM 9.5 10/29/2011 0953   ALKPHOS 59 04/28/2013 1442   ALKPHOS 43 10/29/2011 0953   AST 14 04/28/2013 1442   AST 14 10/29/2011 0953   ALT 14 04/28/2013 1442   ALT 11 10/29/2011 0953   BILITOT 1.13 04/28/2013 1442   BILITOT 0.7 10/29/2011 0953       Impression and Plan:  68 year old woman with the following issues:  1. 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 She is status post laparoscopic  right colectomy done on 02/27/2009 and have been disease free since that time. Her laboratory data and physical examination today do not suggest any relapsed disease. Her last CT scan in September of 2013 did not show any evidence of relapse at this time. She is 5 years out from her operation and does not require any further oncology followup. I have recommended continuing her routine health maintenance with her primary care physician.  2. Surveillance colonoscopies: She is status post 1 in July of 2014 a likely will need to be repeated in 3-4 years.  3. Followup: Will be as needed and we certainly will be happy to see her in the future if any issues arise. No routine oncology followup is required at this time.   [facility], MD 6/11/20152:34 PM

## 2014-04-29 LAB — CEA: CEA: 1.7 ng/mL (ref 0.0–5.0)

## 2014-12-23 ENCOUNTER — Other Ambulatory Visit: Payer: Self-pay

## 2014-12-23 DIAGNOSIS — Z1231 Encounter for screening mammogram for malignant neoplasm of breast: Secondary | ICD-10-CM

## 2015-01-30 ENCOUNTER — Ambulatory Visit
Admission: RE | Admit: 2015-01-30 | Discharge: 2015-01-30 | Disposition: A | Discharge status: Home / Self Care | Payer: Medicare Other | Source: Ambulatory Visit

## 2015-01-30 DIAGNOSIS — Z1231 Encounter for screening mammogram for malignant neoplasm of breast: Secondary | ICD-10-CM

## 2015-12-25 ENCOUNTER — Other Ambulatory Visit: Payer: Self-pay

## 2015-12-25 DIAGNOSIS — Z1231 Encounter for screening mammogram for malignant neoplasm of breast: Secondary | ICD-10-CM

## 2016-01-31 ENCOUNTER — Ambulatory Visit: Payer: Medicare Other

## 2016-02-07 ENCOUNTER — Ambulatory Visit
Admission: RE | Admit: 2016-02-07 | Discharge: 2016-02-07 | Disposition: A | Discharge status: Home / Self Care | Payer: Medicare Other | Source: Ambulatory Visit

## 2016-02-07 DIAGNOSIS — Z1231 Encounter for screening mammogram for malignant neoplasm of breast: Secondary | ICD-10-CM

## 2016-02-08 ENCOUNTER — Other Ambulatory Visit: Payer: Self-pay | Admitting: Family Medicine

## 2016-02-08 DIAGNOSIS — R928 Other abnormal and inconclusive findings on diagnostic imaging of breast: Secondary | ICD-10-CM

## 2016-02-15 ENCOUNTER — Ambulatory Visit
Admission: RE | Admit: 2016-02-15 | Discharge: 2016-02-15 | Disposition: A | Discharge status: Home / Self Care | Payer: Medicare Other | Source: Ambulatory Visit | Attending: Family Medicine | Admitting: Family Medicine

## 2016-02-15 DIAGNOSIS — R928 Other abnormal and inconclusive findings on diagnostic imaging of breast: Secondary | ICD-10-CM

## 2017-01-02 ENCOUNTER — Other Ambulatory Visit: Payer: Self-pay | Admitting: Family Medicine

## 2017-01-02 DIAGNOSIS — Z1231 Encounter for screening mammogram for malignant neoplasm of breast: Secondary | ICD-10-CM

## 2017-02-07 ENCOUNTER — Ambulatory Visit
Admission: RE | Admit: 2017-02-07 | Discharge: 2017-02-07 | Disposition: A | Discharge status: Home / Self Care | Payer: Medicare Other | Source: Ambulatory Visit | Attending: Family Medicine | Admitting: Family Medicine

## 2017-02-07 DIAGNOSIS — Z1231 Encounter for screening mammogram for malignant neoplasm of breast: Secondary | ICD-10-CM

## 2017-02-10 ENCOUNTER — Other Ambulatory Visit: Payer: Self-pay | Admitting: Family Medicine

## 2017-02-10 DIAGNOSIS — R928 Other abnormal and inconclusive findings on diagnostic imaging of breast: Secondary | ICD-10-CM

## 2017-02-14 ENCOUNTER — Other Ambulatory Visit: Payer: Medicare Other

## 2017-02-19 ENCOUNTER — Ambulatory Visit
Admission: RE | Admit: 2017-02-19 | Discharge: 2017-02-19 | Disposition: A | Discharge status: Home / Self Care | Payer: Medicare Other | Source: Ambulatory Visit | Attending: Family Medicine | Admitting: Family Medicine

## 2017-02-19 DIAGNOSIS — R928 Other abnormal and inconclusive findings on diagnostic imaging of breast: Secondary | ICD-10-CM

## 2017-12-11 ENCOUNTER — Other Ambulatory Visit: Payer: Self-pay | Admitting: Family Medicine

## 2017-12-11 DIAGNOSIS — Z1231 Encounter for screening mammogram for malignant neoplasm of breast: Secondary | ICD-10-CM

## 2018-02-09 ENCOUNTER — Ambulatory Visit
Admission: RE | Admit: 2018-02-09 | Discharge: 2018-02-09 | Disposition: A | Discharge status: Home / Self Care | Payer: Medicare Other | Source: Ambulatory Visit | Attending: Family Medicine | Admitting: Family Medicine

## 2018-02-09 DIAGNOSIS — Z1231 Encounter for screening mammogram for malignant neoplasm of breast: Secondary | ICD-10-CM

## 2018-11-25 ENCOUNTER — Other Ambulatory Visit: Payer: Self-pay | Admitting: Family Medicine

## 2018-11-25 DIAGNOSIS — Z1231 Encounter for screening mammogram for malignant neoplasm of breast: Secondary | ICD-10-CM

## 2019-02-11 ENCOUNTER — Ambulatory Visit: Payer: Medicare Other

## 2019-03-10 ENCOUNTER — Ambulatory Visit: Payer: Medicare Other

## 2019-04-28 ENCOUNTER — Ambulatory Visit
Admission: RE | Admit: 2019-04-28 | Discharge: 2019-04-28 | Disposition: A | Discharge status: Home / Self Care | Payer: Medicare Other | Source: Ambulatory Visit | Attending: Family Medicine | Admitting: Family Medicine

## 2019-04-28 ENCOUNTER — Other Ambulatory Visit: Payer: Self-pay

## 2019-04-28 DIAGNOSIS — Z1231 Encounter for screening mammogram for malignant neoplasm of breast: Secondary | ICD-10-CM

## 2019-04-29 ENCOUNTER — Other Ambulatory Visit: Payer: Self-pay | Admitting: Family Medicine

## 2019-04-29 DIAGNOSIS — R928 Other abnormal and inconclusive findings on diagnostic imaging of breast: Secondary | ICD-10-CM

## 2019-05-05 ENCOUNTER — Ambulatory Visit: Payer: Medicare Other

## 2019-05-05 ENCOUNTER — Ambulatory Visit
Admission: RE | Admit: 2019-05-05 | Discharge: 2019-05-05 | Disposition: A | Discharge status: Home / Self Care | Payer: Medicare Other | Source: Ambulatory Visit | Attending: Family Medicine | Admitting: Family Medicine

## 2019-05-05 ENCOUNTER — Other Ambulatory Visit: Payer: Self-pay

## 2019-05-05 DIAGNOSIS — R928 Other abnormal and inconclusive findings on diagnostic imaging of breast: Secondary | ICD-10-CM

## 2020-01-16 ENCOUNTER — Ambulatory Visit: Payer: Medicare Other | Attending: Internal Medicine

## 2020-01-16 DIAGNOSIS — Z23 Encounter for immunization: Secondary | ICD-10-CM

## 2020-01-16 NOTE — Progress Notes (Signed)
   Covid-19 Vaccination Clinic  Name:  [name]    MRN: [id number] DOB: [birth year]  01/16/2020  [name] was observed post Covid-19 immunization for 15 minutes without incidence. She was provided with Vaccine Information Sheet and instruction to access the V-Safe system.   [name] was instructed to call 911 with any severe reactions post vaccine: Marland Kitchen Difficulty breathing  . Swelling of your face and throat  . A fast heartbeat  . A bad rash all over your body  . Dizziness and weakness    Immunizations Administered    Name Date Dose VIS Date Route   Pfizer COVID-19 Vaccine 01/16/2020  4:53 PM 0.3 mL 10/29/2019 Intramuscular   Manufacturer: [place]   Lot: [id number]   [place]: [id number]

## 2020-02-15 ENCOUNTER — Ambulatory Visit: Payer: Medicare Other | Attending: Internal Medicine

## 2020-02-15 DIAGNOSIS — Z23 Encounter for immunization: Secondary | ICD-10-CM

## 2020-02-15 NOTE — Progress Notes (Signed)
   Covid-19 Vaccination Clinic  Name:  [name]    MRN: [id number] DOB: 03/24/1946  02/15/2020  [name] was observed post Covid-19 immunization for 15 minutes without incident. She was provided with Vaccine Information Sheet and instruction to access the V-Safe system.   [name] was instructed to call 911 with any severe reactions post vaccine: Marland Kitchen Difficulty breathing  . Swelling of face and throat  . A fast heartbeat  . A bad rash all over body  . Dizziness and weakness   Immunizations Administered    Name Date Dose VIS Date Route   Pfizer COVID-19 Vaccine 02/15/2020  2:49 PM 0.3 mL 10/29/2019 Intramuscular   Manufacturer: [place]   Lot: [id number]   [place]: [id number]

## 2020-03-15 ENCOUNTER — Other Ambulatory Visit: Payer: Self-pay | Admitting: Family Medicine

## 2020-03-15 DIAGNOSIS — Z1231 Encounter for screening mammogram for malignant neoplasm of breast: Secondary | ICD-10-CM

## 2020-04-28 ENCOUNTER — Ambulatory Visit
Admission: RE | Admit: 2020-04-28 | Discharge: 2020-04-28 | Disposition: A | Discharge status: Home / Self Care | Payer: Medicare Other | Source: Ambulatory Visit | Attending: Family Medicine | Admitting: Family Medicine

## 2020-04-28 ENCOUNTER — Other Ambulatory Visit: Payer: Self-pay

## 2020-04-28 ENCOUNTER — Ambulatory Visit: Payer: Medicare Other

## 2020-04-28 DIAGNOSIS — Z1231 Encounter for screening mammogram for malignant neoplasm of breast: Secondary | ICD-10-CM

## 2020-11-24 ENCOUNTER — Ambulatory Visit: Payer: Medicare Other | Attending: Internal Medicine

## 2020-11-24 DIAGNOSIS — R6889 Other general symptoms and signs: Secondary | ICD-10-CM | POA: Diagnosis not present

## 2020-11-24 DIAGNOSIS — Z23 Encounter for immunization: Secondary | ICD-10-CM

## 2020-11-24 NOTE — Progress Notes (Signed)
   Covid-19 Vaccination Clinic  Name:  Cassiopeia Florentino    MRN: [id number] DOB: Jun 03, 1946  11/24/2020  Ms. Geidel was observed post Covid-19 immunization for 15 minutes without incident. She was provided with Vaccine Information Sheet and instruction to access the V-Safe system.   Ms. Gambrell was instructed to call 911 with any severe reactions post vaccine: Marland Kitchen Difficulty breathing  . Swelling of face and throat  . A fast heartbeat  . A bad rash all over body  . Dizziness and weakness   Immunizations Administered    Name Date Dose VIS Date Route   Pfizer COVID-19 Vaccine 11/24/2020  3:36 PM 0.3 mL 09/06/2020 Intramuscular   Manufacturer: [place]   Lot: [id number]   [facility]: [address]-1

## 2021-04-20 DIAGNOSIS — Z961 Presence of intraocular lens: Secondary | ICD-10-CM | POA: Diagnosis not present

## 2021-04-20 DIAGNOSIS — H543 Unqualified visual loss, both eyes: Secondary | ICD-10-CM | POA: Diagnosis not present

## 2021-04-20 DIAGNOSIS — H538 Other visual disturbances: Secondary | ICD-10-CM | POA: Diagnosis not present

## 2021-04-20 DIAGNOSIS — H44113 Panuveitis, bilateral: Secondary | ICD-10-CM | POA: Diagnosis not present

## 2021-04-24 ENCOUNTER — Other Ambulatory Visit: Payer: Self-pay

## 2021-04-24 ENCOUNTER — Encounter (INDEPENDENT_AMBULATORY_CARE_PROVIDER_SITE_OTHER): Payer: Self-pay | Admitting: Ophthalmology

## 2021-04-24 ENCOUNTER — Ambulatory Visit (INDEPENDENT_AMBULATORY_CARE_PROVIDER_SITE_OTHER): Payer: Medicare Other | Admitting: Ophthalmology

## 2021-04-24 DIAGNOSIS — Z947 Corneal transplant status: Secondary | ICD-10-CM

## 2021-04-24 DIAGNOSIS — H548 Legal blindness, as defined in USA: Secondary | ICD-10-CM | POA: Diagnosis not present

## 2021-04-24 DIAGNOSIS — H35352 Cystoid macular degeneration, left eye: Secondary | ICD-10-CM | POA: Diagnosis not present

## 2021-04-24 DIAGNOSIS — H2640 Unspecified secondary cataract: Secondary | ICD-10-CM

## 2021-04-24 DIAGNOSIS — H3581 Retinal edema: Secondary | ICD-10-CM

## 2021-04-24 DIAGNOSIS — H2701 Aphakia, right eye: Secondary | ICD-10-CM | POA: Diagnosis not present

## 2021-04-24 DIAGNOSIS — H44113 Panuveitis, bilateral: Secondary | ICD-10-CM | POA: Diagnosis not present

## 2021-04-24 DIAGNOSIS — Z961 Presence of intraocular lens: Secondary | ICD-10-CM

## 2021-04-24 DIAGNOSIS — H4440 Unspecified hypotony of eye: Secondary | ICD-10-CM | POA: Diagnosis not present

## 2021-04-24 MED ORDER — KETOROLAC TROMETHAMINE 0.5 % OP SOLN: 1.0000 [drp] | Freq: Four times a day (QID) | OPHTHALMIC | 5 refills | Status: AC

## 2021-04-24 NOTE — Progress Notes (Addendum)
[facility] Note  04/24/2021     CHIEF COMPLAINT Patient presents for Retina Evaluation   HISTORY OF PRESENT ILLNESS: [name] is a 75 y.o. female who presents to the clinic today for:   HPI    Retina Evaluation    In both eyes.  This started 1 week ago.  Duration of 1 week.  Context:  distance vision, mid-range vision, near vision, reading, watching TV, computer work, driving, night driving and dim lighting.  I, the attending physician,  performed the HPI with the patient and updated documentation appropriately.          Comments    Long standing history with [facility] (Dr. Loura Pardon states she could not return to [facility] due to lack of transportation. 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  Pt states vision got significantly worse in April of 2022.  Pt denies eye pain or discomfort.  Pt denies floaters or fol OU.  Pt currently using PF BID OU       Last edited by Bernarda Caffey, MD on 04/24/2021 10:27 PM. (History)    pt is here on the referral of Dr. Frederico Hamman for chronic uveitis, pt states this has been a problem since she was in her 20's, pt states she has no inflammatory diseases, no DM, no HTN, no significant medical hx, pt used to be seen at [facility] by Dr. Tana Felts, but has not seen him since 2014, pt states since then she has followed with Dr. Frederico Hamman, pt states Dr. Tana Felts put her on oral medication (possibly methotrexate), which got rid of the uveitis, pt states in April, she noticed her vision was significantly worse, she was unable to write checks and vision was very dim, pt states Dr. Frederico Hamman gave her an eyedrop that made her vision foggy, so pt told him she did not want to be on that drop anymore, pt saw him last week and he gave her PF  BID OU, Dr. Frederico Hamman also tried to give pt homatropine, but was informed by the pharmacy that it is no longer made, pt last saw Frederico Hamman in 2020 and at that time she was told everything looked good and to come back if needed, she states vision has been okay since then until April  Referring physician: Gevena Cotton, MD [place],  [place] [id number]  HISTORICAL INFORMATION:   Selected notes from the [facility] Referred by Dr. Frederico Hamman for eval of chronic uveitis  Formerly followed by Dr. Tomi Likens at [facility] for panuveitis OU, hypotony OU, CME OS, s/p corneal transplant OD, pseudophakia OS, aphakia OD - last exam there 12.10.2014 -- [facility] NLP OD, 20/126+1 OS   CURRENT MEDICATIONS: Current Outpatient Medications (Ophthalmic Drugs)  Medication Sig  . ketorolac (ACULAR) 0.5 % ophthalmic solution Place 1 drop into the left eye 4 (four) times daily.  . prednisoLONE acetate (PRED FORTE) 1 % ophthalmic suspension Place 1 drop into both eyes 3 (three) times daily.     No current facility-administered medications for this visit. (Ophthalmic Drugs)   Current Outpatient Medications (Other)  Medication Sig  . Ascorbic Acid (VITAMIN C) 100 MG tablet Take 100 mg by mouth daily.  . calcium carbonate (OS-CAL) 600 MG  TABS Take 1,200 mg by mouth 2 (two) times daily with a meal.  . fish oil-omega-3 fatty acids 1000 MG capsule Take 2 g by mouth daily.  . Garlic 10 MG CAPS Take 1 tablet by mouth 2 (two) times daily.  . Multiple Vitamins-Minerals (EYE VITAMINS PO) Take 1 tablet by mouth.  . Multiple Vitamins-Minerals (HAIR VITAMINS PO) Take 1 tablet by mouth as needed.  . Vitamin D, Ergocalciferol, (DRISDOL) 50000 UNITS CAPS Take 50,000 Units by mouth every 7 (seven) days.    No current facility-administered medications for this visit. (Other)      REVIEW OF SYSTEMS: ROS    Positive for: Eyes   Negative for: Constitutional, Gastrointestinal, Neurological, Skin, Genitourinary,  Musculoskeletal, HENT, Endocrine, Cardiovascular, Respiratory, Psychiatric, Allergic/Imm, Heme/Lymph   Last edited by Doneen Poisson on 04/24/2021  2:21 PM. (History)       ALLERGIES No Known Allergies  PAST MEDICAL HISTORY Past Medical History:  Diagnosis Date  . Cancer [facility])    COLON   Past Surgical History:  Procedure Laterality Date  . BELPHAROPTOSIS REPAIR     lift  . COLECTOMY    . CORNIAL TRANSPLANT    . NECK MASS REMOVED      FAMILY HISTORY Family History  Problem Relation Age of Onset  . Hypertension Mother   . Stroke Father   . Hypertension Brother     SOCIAL HISTORY Social History   Tobacco Use  . Smoking status: Current Every Day Smoker    Packs/day: 0.25  Substance Use Topics  . Alcohol use: No  . Drug use: No         OPHTHALMIC EXAM:  Base Eye Exam    Visual Acuity (Snellen - Linear)      Right Left   Dist cc NLP CF @ 1'   Dist ph cc  NI   Correction: Glasses       Tonometry (Tonopen, 2:51 PM)      Right Left   Pressure 0 0       Pupils      Dark Light Shape React APD   Right no view       Left 4 4 Round NR 0       Visual Fields      Left Right   Restrictions Partial outer superior temporal, inferior temporal, superior nasal, inferior nasal deficiencies Total superior temporal, inferior temporal, superior nasal, inferior nasal deficiencies       Extraocular Movement      Right Left    Full Full       Neuro/Psych    Oriented x3: Yes   Mood/Affect: Normal       Dilation    Both eyes: 1.0% Mydriacyl, 2.5% Phenylephrine @ 2:51 PM        Slit Lamp and Fundus Exam    Slit Lamp Exam      Right Left   Lids/Lashes Normal Normal   Conjunctiva/Sclera Melanosis, pre-ticycle Melanosis   Cornea s/p transplant with 360 nylon sutures arcus, 1-2+ fine Punctate epithelial erosions, IK touch at 0600   Anterior Chamber Shallow deep, 1+cell/pigment   Iris poor dilation poorly dilated, 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   Left Eye Quality was poor. Progression has no prior data. Findings include abnormal foveal contour, intraretinal fluid, epiretinal membrane (Irregular RPE contour, central edema).   Notes *Images captured and stored on drive  Diagnosis / Impression:  OD: no images obtained OS: poor image quality -- Irregular RPE contour, central edema / CME  Clinical management:  See below  Abbreviations: NFP - Normal foveal profile. CME - cystoid macular edema. PED - pigment epithelial detachment. IRF - intraretinal fluid. SRF - subretinal fluid. EZ - ellipsoid zone. ERM - epiretinal membrane. ORA - outer retinal atrophy. ORT - outer retinal tubulation. SRHM - subretinal hyper-reflective material. 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 CME (cystoid macular edema), left  H35.352   4. Retinal edema  H35.81 OCT, Retina - OU - Both Eyes  5. Pseudophakia of left eye  Z96.1   6. Anterior capsular opacification  H26.40   7. History of corneal transplant  Z94.7   8. Aphakia of right eye  H27.01   9. 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 Panuveitis w/ hypotony OU and CME OS - [facility] OD NLP; OS CF 1' -- pt reports decreased vision OS starting in April 2022, referred by Dr. Frederico Hamman - IOP 0 OU - long history of panuveitis OU -- previously managed by Dr. 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 Pseudophakia w/ anterior capsular opacification OS  - fibrotic membrane over anterior optic  - may benefit from YAG capsulotomy if drops fail to improve vision  7. History of corneal transplant OD 8. Aphakia OD 9. Legal blindness OD -- [facility] NLP OD since at least 2014    Ophthalmic Meds Ordered this visit:  Meds ordered this encounter  Medications  . ketorolac (ACULAR) 0.5 % ophthalmic solution    Sig: Place 1 drop into the left eye 4 (four) times daily.    Dispense:  10 mL    Refill:  5       Return for f/u 2-3 weeks, uveitis + CME OS, DFE, OCT.  There are no Patient Instructions on file for this visit.   Explained the diagnoses, plan, and follow up with the patient and they expressed understanding.  Patient expressed understanding of the importance of proper follow up care.   This document serves as a record of services personally performed by Gardiner Sleeper, MD, PhD. It was created on their behalf by Estill Bakes, COT an ophthalmic technician. The creation of this record is the provider's dictation and/or activities during the visit.    Electronically signed by: Estill Bakes, COT 6.7.22 @ 10:32 PM  Gardiner Sleeper, M.D., Ph.D. Diseases &  Surgery of the Retina and Vitreous [facility]  I have reviewed the above documentation for accuracy and completeness, and I agree with the above. Gardiner Sleeper, M.D., Ph.D. 04/24/21 10:32 PM   Abbreviations: M myopia (nearsighted); A astigmatism; H hyperopia (farsighted); P presbyopia; Mrx spectacle prescription;  CTL contact lenses; OD right eye; OS left eye; OU both eyes  XT exotropia; ET esotropia; PEK punctate epithelial keratitis; PEE punctate epithelial erosions; DES dry eye syndrome; MGD meibomian gland dysfunction; ATs artificial tears;  PFAT's preservative free artificial tears; [place] nuclear sclerotic cataract; PSC posterior subcapsular cataract; ERM epi-retinal membrane; PVD posterior vitreous detachment; RD retinal detachment; DM diabetes mellitus; DR diabetic retinopathy; NPDR non-proliferative diabetic retinopathy; PDR proliferative diabetic retinopathy; CSME clinically significant macular edema; DME diabetic macular edema; dbh dot blot hemorrhages; CWS cotton wool spot; POAG primary open angle glaucoma; C/D cup-to-disc ratio; HVF humphrey visual field; GVF goldmann visual field; OCT optical coherence tomography; IOP intraocular pressure; BRVO Branch retinal vein occlusion; CRVO central retinal vein occlusion; CRAO central retinal artery occlusion; BRAO branch retinal artery occlusion; RT retinal tear; SB scleral buckle; PPV pars plana vitrectomy; VH Vitreous hemorrhage; PRP panretinal laser photocoagulation; IVK intravitreal kenalog; VMT vitreomacular traction; MH Macular hole;  NVD neovascularization of the disc; NVE neovascularization elsewhere; AREDS age related eye disease study; ARMD age related macular degeneration; POAG primary open angle glaucoma; EBMD epithelial/anterior basement membrane dystrophy; ACIOL anterior chamber intraocular lens; IOL intraocular lens; PCIOL posterior chamber intraocular lens; Phaco/IOL phacoemulsification with intraocular lens placement; [place] photorefractive keratectomy; LASIK laser assisted in situ keratomileusis; HTN hypertension; DM diabetes mellitus; COPD chronic obstructive pulmonary disease

## 2021-04-30 ENCOUNTER — Telehealth (INDEPENDENT_AMBULATORY_CARE_PROVIDER_SITE_OTHER): Payer: Self-pay

## 2021-04-30 DIAGNOSIS — H44113 Panuveitis, bilateral: Secondary | ICD-10-CM

## 2021-04-30 MED ORDER — PREDNISOLONE ACETATE 1 % OP SUSP: 1.0000 [drp] | OPHTHALMIC | 0 refills | Status: DC

## 2021-04-30 NOTE — Telephone Encounter (Signed)
Patient called because she is out of her Prednisolone since it was increased by Dr. Coralyn Pear.  I will call in the new Rx with directions: 1 drop OD BID and 1 drop OS q2hrs. With 0 refill.

## 2021-05-11 NOTE — Progress Notes (Signed)
[facility] Note  05/14/2021     CHIEF COMPLAINT Patient presents for Retina Follow Up   HISTORY OF PRESENT ILLNESS: [name] is a 76 y.o. female who presents to the clinic today for:   HPI     Retina Follow Up   Patient presents with  Other.  In both eyes.  This started 3 weeks ago.  I, the attending physician,  performed the HPI with the patient and updated documentation appropriately.        Comments   Patient here for 3 weeks retina follow up for uveitis OU. Patient states vision doing much better. Was able to write checks and do other tasks. Still using gtts. PF every 2 hrs OS and BID OD. Finished red top yesterday. Ketorolac QID OS.      Last edited by Bernarda Caffey, MD on 05/14/2021  5:29 PM.     pt feels like her vision is back to baseline, she states she is able to write checks now, she noticed the vision was getting better last week, she is able to walk to the store now, she is using PF Q2H OS and BID OD, Ketorolac QID OS and atropine BID OS, which she is out of now  Referring physician: Gevena Cotton, MD [place],  [place] [address]  HISTORICAL INFORMATION:   Selected notes from the [place] Referred by Dr. Frederico Hamman for eval of chronic uveitis  Formerly followed by Dr. Tomi Likens at [facility] for panuveitis OU, hypotony OU, CME OS, s/p corneal transplant OD, pseudophakia OS, aphakia OD - last exam there 12.10.2014 -- BCVA NLP OD, 20/126+1 OS   CURRENT MEDICATIONS: Current Outpatient Medications (Ophthalmic Drugs)  Medication Sig   ketorolac (ACULAR) 0.5 % ophthalmic solution Place 1 drop into the left eye 4 (four) times daily.   prednisoLONE acetate (PRED FORTE) 1 % ophthalmic suspension Place 1 drop into both eyes as directed. Place 1 drop OD BID, 1 drop OS q2hr   No current facility-administered medications for this visit. (Ophthalmic Drugs)   Current Outpatient Medications (Other)   Medication Sig   Ascorbic Acid (VITAMIN C) 100 MG tablet Take 100 mg by mouth daily.   calcium carbonate (OS-CAL) 600 MG TABS Take 1,200 mg by mouth 2 (two) times daily with a meal.   fish oil-omega-3 fatty acids 1000 MG capsule Take 2 g by mouth daily.   Garlic 10 MG CAPS Take 1 tablet by mouth 2 (two) times daily.   Multiple Vitamins-Minerals (EYE VITAMINS PO) Take 1 tablet by mouth.   Multiple Vitamins-Minerals (HAIR VITAMINS PO) Take 1 tablet by mouth as needed.   Vitamin D, Ergocalciferol, (DRISDOL) 50000 UNITS CAPS Take 50,000 Units by mouth every 7 (seven) days.    No current facility-administered medications for this visit. (Other)      REVIEW OF SYSTEMS: ROS   Positive for: Eyes Negative for: Constitutional, Gastrointestinal, Neurological, Skin, Genitourinary, Musculoskeletal, HENT, Endocrine, Cardiovascular, Respiratory, Psychiatric, Allergic/Imm, Heme/Lymph Last edited by Theodore Demark, COA on 05/14/2021  1:56 PM.        ALLERGIES No Known Allergies  PAST MEDICAL HISTORY Past Medical History:  Diagnosis Date   Cancer ([place])    COLON   Past Surgical History:  Procedure Laterality Date   [place]  Family History  Problem Relation Age of Onset   Hypertension Mother    Stroke Father    Hypertension Brother     SOCIAL HISTORY Social History   Tobacco Use   Smoking status: Every Day    Packs/day: 0.25    Pack years: 0.00    Types: Cigarettes  Substance Use Topics   Alcohol use: No   Drug use: No         OPHTHALMIC EXAM:  Base Eye Exam     Visual Acuity (Snellen - Linear)       Right Left   Dist [place] NLP 20/350 -1   Dist ph [place]  20/250 -1         Tonometry   Unable to get pressures. Eye is really soft.        Pupils       Dark Light Shape React APD   Right NO View       Left 4 4 Round NR None         Visual Fields        Left Right   Restrictions Partial outer superior temporal, inferior temporal, superior nasal, inferior nasal deficiencies Total superior temporal, inferior temporal, superior nasal, inferior nasal deficiencies         Extraocular Movement       Right Left    Full Full         Neuro/Psych     Oriented x3: Yes   Mood/Affect: Normal         Dilation     Both eyes: 1.0% Mydriacyl, 2.5% Phenylephrine @ 1:53 PM           Slit Lamp and Fundus Exam     Slit Lamp Exam       Right Left   Lids/Lashes Normal Ptosis   Conjunctiva/Sclera Melanosis, pre-phthisical Melanosis   Cornea s/p transplant with 360 nylon sutures arcus, 1-2+ fine Punctate epithelial erosions, IK touch at 0600   Anterior Chamber Shallow deep, 0.5+cell/pigment   Iris poor dilation poorly dilated, 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   Left Eye Quality was poor. Central Foveal Thickness: 684. Progression has been stable. Findings include abnormal foveal contour, intraretinal fluid, epiretinal membrane (Irregular RPE contour, central edema).   Notes *Images captured and stored on drive  Diagnosis / Impression:  OD: no images obtained OS: poor image quality -- Irregular RPE contour, central edema / CME  Clinical management:  See below  Abbreviations: NFP - Normal foveal profile. CME - cystoid macular edema. PED - pigment epithelial detachment. IRF - intraretinal fluid. SRF - subretinal fluid. EZ - ellipsoid zone. ERM - epiretinal  membrane. ORA - outer retinal atrophy. ORT - outer retinal tubulation. SRHM - subretinal hyper-reflective material. 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 CME (cystoid macular edema), left  H35.352 CANCELED: Injection into [place]'s Capsule - OS - Left Eye    4. Retinal edema  H35.81 OCT, Retina - OU - Both Eyes    5. Pseudophakia of left eye  Z96.1     6. Anterior capsular opacification  H26.40     7. History of corneal transplant  Z94.7     8. Aphakia of right eye  H27.01     9. 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 Panuveitis w/ hypotony OU and CME OS - [facility] OD NLP; OS CF 1' -- pt reports decreased vision OS starting in April 2022, referred by Dr. Frederico Hamman - IOP 0 OU - long history of panuveitis OU -- previously managed by Dr. 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 Pseudophakia w/ anterior capsular opacification OS  - fibrotic membrane over anterior optic  - may benefit from yag capsulotomy if drops fail to improve vision  7. History of corneal transplant OD 8. Aphakia OD 9. Legal blindness OD-BCVA OD NLP since at least 2014    Ophthalmic Meds Ordered this visit:  No orders of the defined types were placed in this encounter.      Return for f/u 3-4 weeks panuveitis / CME OS, DFE, OCT.  There are no Patient Instructions on file for this visit.   Explained the diagnoses, plan, and follow up with the patient and they expressed  understanding.  Patient expressed understanding of the importance of proper follow up care.   This document serves as a record of services personally performed by Gardiner Sleeper, MD, PhD. It was created on their behalf by Roselee Nova, COMT. The creation of this record is the provider's dictation and/or activities during the visit.  Electronically signed by: Roselee Nova, COMT 05/14/21 5:33 PM  This document serves as a record of services personally performed by Gardiner Sleeper, MD, PhD. It was created on their behalf by San Jetty. Owens Shark, OA an ophthalmic technician. The creation of this record is the provider's dictation and/or activities during the visit.    Electronically signed by: San Jetty. Marguerita Merles 06.27.2022 5:33 PM  Gardiner Sleeper, M.D., Ph.D. Diseases & Surgery of the Retina and Vitreous [facility]  I have reviewed the above documentation for accuracy and completeness, and I agree with the above. Gardiner Sleeper, M.D., Ph.D. 05/14/21 5:33 PM  Abbreviations: M myopia (nearsighted); A astigmatism; H hyperopia (farsighted); P presbyopia; Mrx spectacle prescription;  CTL contact lenses; OD right eye; OS left eye; OU both eyes  XT exotropia; ET esotropia; PEK punctate epithelial keratitis; PEE punctate epithelial erosions; DES dry eye syndrome; MGD meibomian gland dysfunction; ATs artificial tears; PFAT's preservative free artificial tears; [place] nuclear sclerotic cataract; PSC posterior subcapsular cataract; ERM epi-retinal membrane; PVD  posterior vitreous detachment; RD retinal detachment; DM diabetes mellitus; DR diabetic retinopathy; NPDR non-proliferative diabetic retinopathy; PDR proliferative diabetic retinopathy; CSME clinically significant macular edema; DME diabetic macular edema; dbh dot blot hemorrhages; CWS cotton wool spot; POAG primary open angle glaucoma; C/D cup-to-disc ratio; HVF humphrey visual field; GVF goldmann visual field; OCT optical coherence  tomography; IOP intraocular pressure; BRVO Branch retinal vein occlusion; CRVO central retinal vein occlusion; CRAO central retinal artery occlusion; BRAO branch retinal artery occlusion; RT retinal tear; SB scleral buckle; PPV pars plana vitrectomy; VH Vitreous hemorrhage; PRP panretinal laser photocoagulation; IVK intravitreal kenalog; VMT vitreomacular traction; MH Macular hole;  NVD neovascularization of the disc; NVE neovascularization elsewhere; AREDS age related eye disease study; ARMD age related macular degeneration; POAG primary open angle glaucoma; EBMD epithelial/anterior basement membrane dystrophy; ACIOL anterior chamber intraocular lens; IOL intraocular lens; PCIOL posterior chamber intraocular lens; Phaco/IOL phacoemulsification with intraocular lens placement; [place] photorefractive keratectomy; LASIK laser assisted in situ keratomileusis; HTN hypertension; DM diabetes mellitus; COPD chronic obstructive pulmonary disease

## 2021-05-14 ENCOUNTER — Ambulatory Visit (INDEPENDENT_AMBULATORY_CARE_PROVIDER_SITE_OTHER): Payer: Medicare Other | Admitting: Ophthalmology

## 2021-05-14 ENCOUNTER — Encounter (INDEPENDENT_AMBULATORY_CARE_PROVIDER_SITE_OTHER): Payer: Self-pay | Admitting: Ophthalmology

## 2021-05-14 ENCOUNTER — Other Ambulatory Visit: Payer: Self-pay

## 2021-05-14 DIAGNOSIS — H35352 Cystoid macular degeneration, left eye: Secondary | ICD-10-CM | POA: Diagnosis not present

## 2021-05-14 DIAGNOSIS — Z947 Corneal transplant status: Secondary | ICD-10-CM | POA: Diagnosis not present

## 2021-05-14 DIAGNOSIS — H548 Legal blindness, as defined in USA: Secondary | ICD-10-CM | POA: Diagnosis not present

## 2021-05-14 DIAGNOSIS — H3581 Retinal edema: Secondary | ICD-10-CM

## 2021-05-14 DIAGNOSIS — Z961 Presence of intraocular lens: Secondary | ICD-10-CM | POA: Diagnosis not present

## 2021-05-14 DIAGNOSIS — H44113 Panuveitis, bilateral: Secondary | ICD-10-CM

## 2021-05-14 DIAGNOSIS — H4440 Unspecified hypotony of eye: Secondary | ICD-10-CM | POA: Diagnosis not present

## 2021-05-14 DIAGNOSIS — H2640 Unspecified secondary cataract: Secondary | ICD-10-CM | POA: Diagnosis not present

## 2021-05-14 DIAGNOSIS — H2701 Aphakia, right eye: Secondary | ICD-10-CM

## 2021-05-30 ENCOUNTER — Other Ambulatory Visit (INDEPENDENT_AMBULATORY_CARE_PROVIDER_SITE_OTHER): Payer: Self-pay | Admitting: Ophthalmology

## 2021-05-30 DIAGNOSIS — H44113 Panuveitis, bilateral: Secondary | ICD-10-CM

## 2021-06-12 ENCOUNTER — Ambulatory Visit (INDEPENDENT_AMBULATORY_CARE_PROVIDER_SITE_OTHER): Payer: Medicare Other | Admitting: Ophthalmology

## 2021-06-12 ENCOUNTER — Encounter (INDEPENDENT_AMBULATORY_CARE_PROVIDER_SITE_OTHER): Payer: Self-pay | Admitting: Ophthalmology

## 2021-06-12 ENCOUNTER — Other Ambulatory Visit: Payer: Self-pay

## 2021-06-12 DIAGNOSIS — Z947 Corneal transplant status: Secondary | ICD-10-CM | POA: Diagnosis not present

## 2021-06-12 DIAGNOSIS — H2701 Aphakia, right eye: Secondary | ICD-10-CM | POA: Diagnosis not present

## 2021-06-12 DIAGNOSIS — H44113 Panuveitis, bilateral: Secondary | ICD-10-CM

## 2021-06-12 DIAGNOSIS — H3581 Retinal edema: Secondary | ICD-10-CM

## 2021-06-12 DIAGNOSIS — Z961 Presence of intraocular lens: Secondary | ICD-10-CM

## 2021-06-12 DIAGNOSIS — H548 Legal blindness, as defined in USA: Secondary | ICD-10-CM

## 2021-06-12 DIAGNOSIS — H35352 Cystoid macular degeneration, left eye: Secondary | ICD-10-CM | POA: Diagnosis not present

## 2021-06-12 DIAGNOSIS — H4440 Unspecified hypotony of eye: Secondary | ICD-10-CM

## 2021-06-12 DIAGNOSIS — H2640 Unspecified secondary cataract: Secondary | ICD-10-CM | POA: Diagnosis not present

## 2021-06-12 MED ORDER — PREDNISOLONE ACETATE 1 % OP SUSP: OPHTHALMIC | 6 refills | Status: DC

## 2021-06-12 NOTE — Progress Notes (Signed)
[facility] Note  06/12/2021     CHIEF COMPLAINT Patient presents for Retina Follow Up   HISTORY OF PRESENT ILLNESS: [name] is a 75 y.o. female who presents to the clinic today for:   HPI     Retina Follow Up   Patient presents with  Other.  In left eye.  This started 4 weeks ago.  I, the attending physician,  performed the HPI with the patient and updated documentation appropriately.        Comments   Patient here for 4 weeks retina follow up for panuveitis CME OS. Patient states vision doing good. Hasn't seen floaters in a while. Can see asile numbers in grocery stores. Went to [facility] was dark in the past. This time was bright. Able to write checks on line instead of going off the line.       Last edited by Bernarda Caffey, MD on 06/12/2021  3:24 PM.     Referring physician: Gevena Cotton, MD [address] [place],  [place] [address]  HISTORICAL INFORMATION:   Selected notes from the MEDICAL RECORD NUMBER Referred by Dr. 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 Tomi Likens at [facility] for panuveitis OU, hypotony OU, CME OS, s/p corneal transplant OD, pseudophakia OS, aphakia OD - last exam there 12.10.2014 -- BCVA NLP OD, 20/126+1 OS   CURRENT MEDICATIONS: Current Outpatient Medications (Ophthalmic Drugs)  Medication Sig   ketorolac (ACULAR) 0.5 % ophthalmic solution Place 1 drop into the left eye 4 (four) times daily.   prednisoLONE acetate (PRED FORTE) 1 % ophthalmic suspension PLACE 1 DROP INTO RIGHT EYE TWICE A DAY , 1 DROP INTO LEFT EYE EVERY 2 HOURS   No current facility-administered medications for this visit. (Ophthalmic Drugs)   Current Outpatient Medications (Other)  Medication Sig   Ascorbic Acid (VITAMIN C) 100 MG tablet Take 100 mg by mouth daily.   calcium carbonate (OS-CAL) 600 MG TABS Take 1,200 mg by mouth 2 (two) times daily with a meal.   fish oil-omega-3 fatty acids 1000 MG capsule  Take 2 g by mouth daily.   Garlic 10 MG CAPS Take 1 tablet by mouth 2 (two) times daily.   Multiple Vitamins-Minerals (EYE VITAMINS PO) Take 1 tablet by mouth.   Multiple Vitamins-Minerals (HAIR VITAMINS PO) Take 1 tablet by mouth as needed.   Vitamin D, Ergocalciferol, (DRISDOL) 50000 UNITS CAPS Take 50,000 Units by mouth every 7 (seven) days.    No current facility-administered medications for this visit. (Other)      REVIEW OF SYSTEMS: ROS   Positive for: Eyes Negative for: Constitutional, Gastrointestinal, Neurological, Skin, Genitourinary, Musculoskeletal, HENT, Endocrine, Cardiovascular, Respiratory, Psychiatric, Allergic/Imm, Heme/Lymph Last edited by Theodore Demark, COA on 06/12/2021  2:16 PM.         ALLERGIES No Known Allergies  PAST MEDICAL HISTORY Past Medical History:  Diagnosis Date   Cancer ([place])    COLON   Past Surgical History:  Procedure Laterality Date   BELPHAROPTOSIS REPAIR     lift   COLECTOMY     CORNIAL TRANSPLANT     NECK MASS REMOVED      FAMILY HISTORY Family History  Problem Relation Age of Onset   Hypertension Mother    Stroke Father    Hypertension Brother     SOCIAL HISTORY Social History   Tobacco Use   Smoking status: Every Day    Packs/day: 0.25  Types: Cigarettes  Substance Use Topics   Alcohol use: No   Drug use: No         OPHTHALMIC EXAM:  Base Eye Exam     Visual Acuity (Snellen - Linear)       Right Left   Dist [place] NLP 20/250   Dist ph   20/200 -2         Tonometry (Tonopen, 2:12 PM)       Right Left   Pressure unab unab  unable        Pupils       Dark Light Shape React APD   Right NO VIEW       Left 4 4 Round NR None         Visual Fields       Left Right   Restrictions Partial outer superior temporal, inferior temporal, superior nasal, inferior nasal deficiencies Total superior temporal, inferior temporal, superior nasal, inferior nasal deficiencies         Extraocular  Movement       Right Left    Full Full         Neuro/Psych     Oriented x3: Yes   Mood/Affect: Normal         Dilation     Both eyes: 1.0% Mydriacyl, 2.5% Phenylephrine @ 2:11 PM           Slit Lamp and Fundus Exam     Slit Lamp Exam       Right Left   Lids/Lashes Normal Ptosis   Conjunctiva/Sclera Melanosis, pre-phthisical Melanosis   Cornea s/p transplant with 360 nylon sutures arcus, 1-2+ fine Punctate epithelial erosions, IK touch at 0600   Anterior Chamber Shallow deep, 0.5+cell/pigment   Iris poor dilation poorly dilated, Posterior synechiae almost 360, opening from 0130-0400, surgical PI at 1100   Lens aphakia; fibrosis Posterior chamber intraocular lens with dense fibrosis over anterior optic   Vitreous no view no view         Fundus Exam       Right Left   Disc no view no view   Macula  very hazy view -- grossly attached   Periphery  very hazy view -- grossly attached.  Pigmented CR scarring.            IMAGING AND PROCEDURES  Imaging and Procedures for 06/12/2021  OCT, Retina - OU - Both Eyes       Right Eye Quality was poor. Progression has no prior data.   Left Eye Quality was borderline. Central Foveal Thickness: 450. Progression has improved. Findings include abnormal foveal contour, intraretinal fluid, epiretinal membrane, outer retinal atrophy, no SRF (Interval improvement in image quality; irregular RPE contour; ERM w/ central thickening/CME improved).   Notes *Images captured and stored on drive  Diagnosis / Impression:  OD: no images obtained OS: Interval improvement in image quality; irregular RPE contour; ERM w/ central thickening/CME improved  Clinical management:  See below  Abbreviations: NFP - Normal foveal profile. CME - cystoid macular edema. PED - pigment epithelial detachment. IRF - intraretinal fluid. SRF - subretinal fluid. EZ - ellipsoid zone. ERM - epiretinal membrane. ORA - outer retinal atrophy. ORT - outer  retinal tubulation. SRHM - subretinal hyper-reflective material. 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 CME (cystoid macular edema), left  H35.352     4. Retinal edema  H35.81 OCT, Retina - OU - Both Eyes    5. Pseudophakia of left eye  Z96.1     6. Anterior capsular opacification  H26.40     7. History of corneal transplant  Z94.7     8. Aphakia of right eye  H27.01     9. 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 Panuveitis w/ hypotony OU and CME OS - VA OD NLP; OS CF 1' -- pt reports decreased vision OS starting in April 2022, referred by Dr. Frederico Hamman - IOP UTO OU - long history of panuveitis OU -- previously managed by Dr. 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 Pseudophakia w/ anterior capsular opacification OS  - fibrotic membrane over anterior optic  - may benefit from yag capsulotomy if drops fail to improve vision  7. History of corneal transplant OD  8. Aphakia OD  9. Legal blindness OD-BCVA OD NLP since at least 2014  Ophthalmic Meds Ordered this visit:  Meds ordered this encounter  Medications   prednisoLONE acetate (PRED FORTE) 1 % ophthalmic suspension    Sig: PLACE 1 DROP INTO RIGHT EYE TWICE A DAY , 1 DROP INTO LEFT EYE EVERY 2 HOURS    Dispense:  15 mL    Refill:  6    DX Code Needed  .       Return in about 6 weeks (around 07/24/2021) for 6 wk f/u w/DFE/OCT/Optos FA (Transit  OS).  There are no Patient Instructions on file for this visit.   Explained the diagnoses, plan, and follow up with the patient and they expressed understanding.  Patient expressed understanding of the importance of proper follow up care.   This document serves as a record of services personally performed by Gardiner Sleeper, MD, PhD. It was created on their behalf by Estill Bakes, COT an ophthalmic technician. The creation of this record is the provider's dictation and/or activities during the visit.    Electronically signed by: Estill Bakes, COT 7.26.22 @ 3:45 PM   Gardiner Sleeper, M.D., Ph.D. Diseases & Surgery of the Retina and [place] 7.26.22  I have reviewed the above documentation for accuracy and completeness, and I agree with the above. Gardiner Sleeper, M.D., Ph.D. 06/12/21 3:45 PM   Abbreviations: M myopia (nearsighted); A astigmatism; H hyperopia (farsighted); P presbyopia; Mrx spectacle prescription;  CTL contact lenses; OD right eye; OS left eye; OU both eyes  XT exotropia; ET esotropia; PEK punctate epithelial keratitis; PEE punctate epithelial erosions; DES dry eye syndrome; MGD meibomian gland dysfunction; ATs artificial tears; PFAT's preservative free artificial tears; [place] nuclear sclerotic cataract; PSC posterior subcapsular cataract; ERM epi-retinal membrane; PVD posterior vitreous detachment; RD retinal detachment; DM diabetes mellitus; DR diabetic retinopathy; NPDR non-proliferative diabetic retinopathy; PDR proliferative diabetic retinopathy; CSME clinically significant macular edema; DME diabetic macular edema; dbh dot blot  hemorrhages; CWS cotton wool spot; POAG primary open angle glaucoma; C/D cup-to-disc ratio; HVF humphrey visual field; GVF goldmann visual field; OCT optical coherence tomography; IOP intraocular pressure; BRVO Branch retinal vein occlusion; CRVO central retinal vein occlusion; CRAO central retinal artery occlusion;  BRAO branch retinal artery occlusion; RT retinal tear; SB scleral buckle; PPV pars plana vitrectomy; VH Vitreous hemorrhage; PRP panretinal laser photocoagulation; IVK intravitreal kenalog; VMT vitreomacular traction; MH Macular hole;  NVD neovascularization of the disc; NVE neovascularization elsewhere; AREDS age related eye disease study; ARMD age related macular degeneration; POAG primary open angle glaucoma; EBMD epithelial/anterior basement membrane dystrophy; ACIOL anterior chamber intraocular lens; IOL intraocular lens; PCIOL posterior chamber intraocular lens; Phaco/IOL phacoemulsification with intraocular lens placement; [place] photorefractive keratectomy; LASIK laser assisted in situ keratomileusis; HTN hypertension; DM diabetes mellitus; COPD chronic obstructive pulmonary disease

## 2021-06-15 ENCOUNTER — Other Ambulatory Visit: Payer: Self-pay | Admitting: Family Medicine

## 2021-06-15 DIAGNOSIS — Z1231 Encounter for screening mammogram for malignant neoplasm of breast: Secondary | ICD-10-CM

## 2021-07-24 ENCOUNTER — Other Ambulatory Visit: Payer: Self-pay

## 2021-07-24 ENCOUNTER — Encounter (INDEPENDENT_AMBULATORY_CARE_PROVIDER_SITE_OTHER): Payer: Self-pay | Admitting: Ophthalmology

## 2021-07-24 ENCOUNTER — Ambulatory Visit (INDEPENDENT_AMBULATORY_CARE_PROVIDER_SITE_OTHER): Payer: Medicare Other | Admitting: Ophthalmology

## 2021-07-24 DIAGNOSIS — Z961 Presence of intraocular lens: Secondary | ICD-10-CM | POA: Diagnosis not present

## 2021-07-24 DIAGNOSIS — H2701 Aphakia, right eye: Secondary | ICD-10-CM

## 2021-07-24 DIAGNOSIS — H3581 Retinal edema: Secondary | ICD-10-CM | POA: Diagnosis not present

## 2021-07-24 DIAGNOSIS — H44113 Panuveitis, bilateral: Secondary | ICD-10-CM

## 2021-07-24 DIAGNOSIS — H2640 Unspecified secondary cataract: Secondary | ICD-10-CM

## 2021-07-24 DIAGNOSIS — H4440 Unspecified hypotony of eye: Secondary | ICD-10-CM

## 2021-07-24 DIAGNOSIS — H548 Legal blindness, as defined in USA: Secondary | ICD-10-CM | POA: Diagnosis not present

## 2021-07-24 DIAGNOSIS — H35352 Cystoid macular degeneration, left eye: Secondary | ICD-10-CM | POA: Diagnosis not present

## 2021-07-24 DIAGNOSIS — Z947 Corneal transplant status: Secondary | ICD-10-CM | POA: Diagnosis not present

## 2021-07-24 MED ORDER — PREDNISOLONE ACETATE 1 % OP SUSP: OPHTHALMIC | 6 refills | Status: AC

## 2021-07-24 NOTE — Progress Notes (Signed)
[facility] Note  07/24/2021     CHIEF COMPLAINT Patient presents for Retina Follow Up   HISTORY OF PRESENT ILLNESS: [name] is a 75 y.o. female who presents to the clinic today for:   HPI     Retina Follow Up   Patient presents with  Other.  In both eyes.  This started 6 weeks ago.  I, the attending physician,  performed the HPI with the patient and updated documentation appropriately.        Comments   Patient here for 6 weeks retina follow up for Panuveitis OU. Patient states vision doing good. Read numbers on blood pressure monitor. Wasn't able to do that before without glasses. No eye pain.       Last edited by Bernarda Caffey, MD on 07/24/2021  9:29 PM.    Pt refused FA today, she states no change in vision, no eye pain in OD, pt states she "tries" to use PF q2h, she is using Ketorolac QID, but has not been using Atropine   Referring physician: Alroy Dust, L[address], [address] [place],  [place] [address]  HISTORICAL INFORMATION:   Selected notes from the MEDICAL RECORD NUMBER Referred by Dr. 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 Tomi Likens at [facility] for panuveitis OU, hypotony OU, CME OS, s/p corneal transplant OD, pseudophakia OS, aphakia OD - last exam there 12.10.2014 -- BCVA NLP OD, 20/126+1 OS   CURRENT MEDICATIONS: Current Outpatient Medications (Ophthalmic Drugs)  Medication Sig   ketorolac (ACULAR) 0.5 % ophthalmic solution Place 1 drop into the left eye 4 (four) times daily.   prednisoLONE acetate (PRED FORTE) 1 % ophthalmic suspension PLACE 1 DROP INTO RIGHT EYE TWICE A DAY , 1 DROP INTO LEFT EYE EVERY 2 HOURS   No current facility-administered medications for this visit. (Ophthalmic Drugs)   Current Outpatient Medications (Other)  Medication Sig   Ascorbic Acid (VITAMIN C) 100 MG tablet Take 100 mg by mouth daily.   calcium carbonate (OS-CAL) 600 MG TABS Take 1,200 mg by mouth 2  (two) times daily with a meal.   fish oil-omega-3 fatty acids 1000 MG capsule Take 2 g by mouth daily.   Garlic 10 MG CAPS Take 1 tablet by mouth 2 (two) times daily.   Multiple Vitamins-Minerals (EYE VITAMINS PO) Take 1 tablet by mouth.   Multiple Vitamins-Minerals (HAIR VITAMINS PO) Take 1 tablet by mouth as needed.   Vitamin D, Ergocalciferol, (DRISDOL) 50000 UNITS CAPS Take 50,000 Units by mouth every 7 (seven) days.    No current facility-administered medications for this visit. (Other)      REVIEW OF SYSTEMS: ROS   Positive for: Eyes Negative for: Constitutional, Gastrointestinal, Neurological, Skin, Genitourinary, Musculoskeletal, HENT, Endocrine, Cardiovascular, Respiratory, Psychiatric, Allergic/Imm, Heme/Lymph Last edited by Theodore Demark, COA on 07/24/2021  2:58 PM.          ALLERGIES No Known Allergies  PAST MEDICAL HISTORY Past Medical History:  Diagnosis Date   Cancer ([place])    COLON   Past Surgical History:  Procedure Laterality Date   BELPHAROPTOSIS REPAIR     lift   COLECTOMY     CORNIAL TRANSPLANT     NECK MASS REMOVED      FAMILY HISTORY Family History  Problem Relation Age of Onset   Hypertension Mother    Stroke Father    Hypertension Brother     SOCIAL HISTORY Social History  Tobacco Use   Smoking status: Every Day    Packs/day: 0.25    Types: Cigarettes  Substance Use Topics   Alcohol use: No   Drug use: No         OPHTHALMIC EXAM:  Base Eye Exam     Visual Acuity (Snellen - Linear)       Right Left   Dist [place] NLP 20/400 -1   Dist ph [place]  20/300 -1         Tonometry   Unable to get pressure. Eye too soft.        Pupils       Dark Light Shape React APD   Right NO VIEW       Left 4 4 Round NR None         Visual Fields       Left Right   Restrictions Partial outer superior temporal, inferior temporal, superior nasal, inferior nasal deficiencies Total superior temporal, inferior temporal, superior  nasal, inferior nasal deficiencies         Extraocular Movement       Right Left    Full Full         Neuro/Psych     Oriented x3: Yes   Mood/Affect: Normal         Dilation     Both eyes: 1.0% Mydriacyl, 2.5% Phenylephrine @ 2:53 PM           Slit Lamp and Fundus Exam     Slit Lamp Exam       Right Left   Lids/Lashes Normal Ptosis   Conjunctiva/Sclera Melanosis, pre-phthisical Melanosis   Cornea s/p transplant with 360 nylon sutures arcus, 1-2+ fine Punctate epithelial erosions, IK touch at 0600, 1-2+ Descemet's folds   Anterior Chamber Shallow deep, 0.5+cell/pigment   Iris poor dilation poorly dilated, 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 Progression has no prior data.   Left Eye Quality was poor. Central Foveal Thickness: 548. Progression has improved. Findings include abnormal foveal contour, intraretinal fluid, epiretinal membrane, outer retinal atrophy, no SRF (Poor image quality, irregular RPE contour; ERM w/ central thickening/CME ).   Notes *Images captured and stored on drive  Diagnosis / Impression:  OD: no images obtained OS: poor image quality; irregular RPE contour; ERM w/ central thickening/CME   Clinical management:  See below  Abbreviations: NFP - Normal foveal profile. CME - cystoid macular edema. PED - pigment epithelial detachment. IRF - intraretinal fluid. SRF - subretinal fluid. EZ - ellipsoid zone. ERM -  epiretinal membrane. ORA - outer retinal atrophy. ORT - outer retinal tubulation. SRHM - subretinal hyper-reflective material. IRHM - intraretinal hyper-reflective material             ASSESSMENT/PLAN:    ICD-10-CM   1. 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 CME (cystoid macular edema), left  H35.352     4. Retinal edema  H35.81 OCT, Retina - OU - Both Eyes    5. Pseudophakia of left eye  Z96.1     6. Anterior capsular opacification  H26.40     7. History of corneal transplant  Z94.7     8. Aphakia of right eye  H27.01     9. 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 Panuveitis w/ hypotony OU and CME OS - [facility] OD NLP; OS CF 1' -- pt reports decreased vision OS starting in April 2022, referred by Dr. Frederico Hamman - IOP UTO OU - long history of panuveitis OU -- previously managed by Dr. 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 Pseudophakia w/ anterior capsular opacification OS  - fibrotic membrane over anterior optic  - may benefit from yag capsulotomy if drops fail to improve vision  7. History of corneal transplant OD  8. Aphakia OD  9. Legal blindness OD-BCVA OD NLP since at least 2014  Ophthalmic Meds Ordered this visit:  Meds ordered this encounter  Medications   prednisoLONE acetate (PRED FORTE) 1 % ophthalmic suspension    Sig: PLACE 1 DROP INTO RIGHT EYE TWICE A DAY , 1 DROP INTO LEFT EYE EVERY 2 HOURS    Dispense:  15 mL    Refill:  6    DX Code Needed   .       Return in about 6 weeks (around 09/04/2021) for f/u panuveitis OS, DFE, OCT.  There are no Patient Instructions on file for this visit.   Explained the diagnoses, plan, and follow up with the patient and they expressed understanding.  Patient expressed understanding of the importance of proper follow up care.   This document serves as a record of services personally performed by Gardiner Sleeper, MD, PhD. It was created on their behalf by Estill Bakes, COT an ophthalmic technician. The creation of this record is the provider's dictation and/or activities during the visit.    Electronically signed by: Estill Bakes, COT 9.6.22 @ 9:32 PM   This document serves as a record of services personally performed by Gardiner Sleeper, MD, PhD. It was created on their behalf by San Jetty. Owens Shark, OA an ophthalmic technician. The creation of this record is the provider's dictation and/or activities during the visit.    Electronically signed by: San Jetty. Owens Shark, [place] 09.06.2022 9:32 PM   Gardiner Sleeper, M.D., Ph.D. Diseases & Surgery of the Retina and Vitreous [facility]  I have reviewed the above documentation for accuracy and completeness, and I agree with the above. Gardiner Sleeper, M.D., Ph.D. 07/24/21 9:32 PM  Abbreviations: M myopia (nearsighted); A astigmatism; H hyperopia (farsighted); P presbyopia; Mrx spectacle prescription;  CTL contact lenses; OD right eye; OS  left eye; OU both eyes  XT exotropia; ET esotropia; PEK punctate epithelial keratitis; PEE punctate epithelial erosions; DES dry eye syndrome; MGD meibomian gland dysfunction; ATs artificial tears; PFAT's preservative free artificial tears; [place] nuclear sclerotic cataract; PSC posterior subcapsular cataract; ERM epi-retinal membrane; PVD posterior vitreous detachment; RD retinal detachment; DM diabetes mellitus; DR diabetic retinopathy; NPDR non-proliferative diabetic retinopathy; PDR proliferative diabetic  retinopathy; CSME clinically significant macular edema; DME diabetic macular edema; dbh dot blot hemorrhages; CWS cotton wool spot; POAG primary open angle glaucoma; C/D cup-to-disc ratio; HVF humphrey visual field; GVF goldmann visual field; OCT optical coherence tomography; IOP intraocular pressure; BRVO Branch retinal vein occlusion; CRVO central retinal vein occlusion; CRAO central retinal artery occlusion; BRAO branch retinal artery occlusion; RT retinal tear; SB scleral buckle; PPV pars plana vitrectomy; VH Vitreous hemorrhage; PRP panretinal laser photocoagulation; IVK intravitreal kenalog; VMT vitreomacular traction; MH Macular hole;  NVD neovascularization of the disc; NVE neovascularization elsewhere; AREDS age related eye disease study; ARMD age related macular degeneration; POAG primary open angle glaucoma; EBMD epithelial/anterior basement membrane dystrophy; ACIOL anterior chamber intraocular lens; IOL intraocular lens; PCIOL posterior chamber intraocular lens; Phaco/IOL phacoemulsification with intraocular lens placement; [place] photorefractive keratectomy; LASIK laser assisted in situ keratomileusis; HTN hypertension; DM diabetes mellitus; COPD chronic obstructive pulmonary disease

## 2021-09-04 ENCOUNTER — Encounter (INDEPENDENT_AMBULATORY_CARE_PROVIDER_SITE_OTHER): Payer: Medicare Other | Admitting: Ophthalmology

## 2021-09-04 DIAGNOSIS — R6889 Other general symptoms and signs: Secondary | ICD-10-CM | POA: Diagnosis not present

## 2021-09-04 DIAGNOSIS — Z131 Encounter for screening for diabetes mellitus: Secondary | ICD-10-CM | POA: Diagnosis not present

## 2021-09-04 DIAGNOSIS — Z0000 Encounter for general adult medical examination without abnormal findings: Secondary | ICD-10-CM | POA: Diagnosis not present

## 2021-09-05 ENCOUNTER — Other Ambulatory Visit: Payer: Self-pay | Admitting: Family Medicine

## 2021-09-05 DIAGNOSIS — M8580 Other specified disorders of bone density and structure, unspecified site: Secondary | ICD-10-CM

## 2021-09-05 DIAGNOSIS — E2839 Other primary ovarian failure: Secondary | ICD-10-CM

## 2021-09-07 ENCOUNTER — Encounter (INDEPENDENT_AMBULATORY_CARE_PROVIDER_SITE_OTHER): Payer: Medicare Other | Admitting: Ophthalmology

## 2021-09-10 ENCOUNTER — Ambulatory Visit: Payer: Medicare Other

## 2021-09-24 ENCOUNTER — Ambulatory Visit
Admission: RE | Admit: 2021-09-24 | Discharge: 2021-09-24 | Disposition: A | Discharge status: Home / Self Care | Payer: Medicare Other | Source: Ambulatory Visit | Attending: Family Medicine | Admitting: Family Medicine

## 2021-09-24 DIAGNOSIS — Z1231 Encounter for screening mammogram for malignant neoplasm of breast: Secondary | ICD-10-CM | POA: Diagnosis not present

## 2021-09-28 DIAGNOSIS — Z961 Presence of intraocular lens: Secondary | ICD-10-CM | POA: Diagnosis not present

## 2021-09-28 DIAGNOSIS — Z947 Corneal transplant status: Secondary | ICD-10-CM | POA: Diagnosis not present

## 2021-09-28 DIAGNOSIS — H35352 Cystoid macular degeneration, left eye: Secondary | ICD-10-CM | POA: Diagnosis not present

## 2021-09-28 DIAGNOSIS — H4440 Unspecified hypotony of eye: Secondary | ICD-10-CM | POA: Diagnosis not present

## 2021-09-28 DIAGNOSIS — H44113 Panuveitis, bilateral: Secondary | ICD-10-CM | POA: Diagnosis not present

## 2021-09-28 DIAGNOSIS — H2701 Aphakia, right eye: Secondary | ICD-10-CM | POA: Diagnosis not present

## 2021-11-28 DIAGNOSIS — Z947 Corneal transplant status: Secondary | ICD-10-CM | POA: Diagnosis not present

## 2021-11-28 DIAGNOSIS — Z961 Presence of intraocular lens: Secondary | ICD-10-CM | POA: Diagnosis not present

## 2021-11-28 DIAGNOSIS — H44113 Panuveitis, bilateral: Secondary | ICD-10-CM | POA: Diagnosis not present

## 2021-11-28 DIAGNOSIS — H4440 Unspecified hypotony of eye: Secondary | ICD-10-CM | POA: Diagnosis not present

## 2021-11-28 DIAGNOSIS — H2701 Aphakia, right eye: Secondary | ICD-10-CM | POA: Diagnosis not present

## 2022-01-07 DIAGNOSIS — Z961 Presence of intraocular lens: Secondary | ICD-10-CM | POA: Diagnosis not present

## 2022-01-07 DIAGNOSIS — Z947 Corneal transplant status: Secondary | ICD-10-CM | POA: Diagnosis not present

## 2022-01-07 DIAGNOSIS — H35352 Cystoid macular degeneration, left eye: Secondary | ICD-10-CM | POA: Diagnosis not present

## 2022-01-07 DIAGNOSIS — H540X43 Blindness right eye category 4, blindness left eye category 3: Secondary | ICD-10-CM | POA: Diagnosis not present

## 2022-01-07 DIAGNOSIS — H4440 Unspecified hypotony of eye: Secondary | ICD-10-CM | POA: Diagnosis not present

## 2022-01-07 DIAGNOSIS — H5372 Impaired contrast sensitivity: Secondary | ICD-10-CM | POA: Diagnosis not present

## 2022-01-07 DIAGNOSIS — H44113 Panuveitis, bilateral: Secondary | ICD-10-CM | POA: Diagnosis not present

## 2022-01-07 DIAGNOSIS — H2701 Aphakia, right eye: Secondary | ICD-10-CM | POA: Diagnosis not present

## 2022-02-06 ENCOUNTER — Other Ambulatory Visit: Payer: Medicare Other

## 2022-02-06 DIAGNOSIS — H2701 Aphakia, right eye: Secondary | ICD-10-CM | POA: Diagnosis not present

## 2022-02-06 DIAGNOSIS — H44113 Panuveitis, bilateral: Secondary | ICD-10-CM | POA: Diagnosis not present

## 2022-02-06 DIAGNOSIS — Z947 Corneal transplant status: Secondary | ICD-10-CM | POA: Diagnosis not present

## 2022-02-06 DIAGNOSIS — Z961 Presence of intraocular lens: Secondary | ICD-10-CM | POA: Diagnosis not present

## 2022-02-06 DIAGNOSIS — H4440 Unspecified hypotony of eye: Secondary | ICD-10-CM | POA: Diagnosis not present

## 2022-04-17 ENCOUNTER — Other Ambulatory Visit: Payer: Self-pay | Admitting: Family Medicine

## 2022-04-17 DIAGNOSIS — Z1231 Encounter for screening mammogram for malignant neoplasm of breast: Secondary | ICD-10-CM

## 2022-04-19 ENCOUNTER — Ambulatory Visit
Admission: RE | Admit: 2022-04-19 | Discharge: 2022-04-19 | Disposition: A | Discharge status: Home / Self Care | Payer: Medicare Other | Source: Ambulatory Visit | Attending: Family Medicine | Admitting: Family Medicine

## 2022-04-19 DIAGNOSIS — Z780 Asymptomatic menopausal state: Secondary | ICD-10-CM | POA: Diagnosis not present

## 2022-04-19 DIAGNOSIS — M85851 Other specified disorders of bone density and structure, right thigh: Secondary | ICD-10-CM | POA: Diagnosis not present

## 2022-04-19 DIAGNOSIS — E2839 Other primary ovarian failure: Secondary | ICD-10-CM

## 2022-04-19 DIAGNOSIS — M810 Age-related osteoporosis without current pathological fracture: Secondary | ICD-10-CM | POA: Diagnosis not present

## 2022-04-19 DIAGNOSIS — M8580 Other specified disorders of bone density and structure, unspecified site: Secondary | ICD-10-CM

## 2022-05-15 DIAGNOSIS — Z961 Presence of intraocular lens: Secondary | ICD-10-CM | POA: Diagnosis not present

## 2022-05-15 DIAGNOSIS — H2701 Aphakia, right eye: Secondary | ICD-10-CM | POA: Diagnosis not present

## 2022-05-15 DIAGNOSIS — H540X43 Blindness right eye category 4, blindness left eye category 3: Secondary | ICD-10-CM | POA: Diagnosis not present

## 2022-05-15 DIAGNOSIS — H5372 Impaired contrast sensitivity: Secondary | ICD-10-CM | POA: Diagnosis not present

## 2022-05-15 DIAGNOSIS — H4440 Unspecified hypotony of eye: Secondary | ICD-10-CM | POA: Diagnosis not present

## 2022-05-15 DIAGNOSIS — H44113 Panuveitis, bilateral: Secondary | ICD-10-CM | POA: Diagnosis not present

## 2022-05-15 DIAGNOSIS — H35352 Cystoid macular degeneration, left eye: Secondary | ICD-10-CM | POA: Diagnosis not present

## 2022-07-02 DIAGNOSIS — H35352 Cystoid macular degeneration, left eye: Secondary | ICD-10-CM | POA: Diagnosis not present

## 2022-07-02 DIAGNOSIS — H2701 Aphakia, right eye: Secondary | ICD-10-CM | POA: Diagnosis not present

## 2022-09-18 DIAGNOSIS — H44113 Panuveitis, bilateral: Secondary | ICD-10-CM | POA: Diagnosis not present

## 2022-09-25 ENCOUNTER — Ambulatory Visit: Payer: Medicare Other

## 2022-10-29 ENCOUNTER — Ambulatory Visit
Admission: RE | Admit: 2022-10-29 | Discharge: 2022-10-29 | Disposition: A | Discharge status: Home / Self Care | Payer: Medicare Other | Source: Ambulatory Visit | Attending: Family Medicine | Admitting: Family Medicine

## 2022-10-29 DIAGNOSIS — Z1231 Encounter for screening mammogram for malignant neoplasm of breast: Secondary | ICD-10-CM | POA: Diagnosis not present

## 2023-01-15 DIAGNOSIS — H1789 Other corneal scars and opacities: Secondary | ICD-10-CM | POA: Diagnosis not present

## 2023-01-15 DIAGNOSIS — H2012 Chronic iridocyclitis, left eye: Secondary | ICD-10-CM | POA: Diagnosis not present

## 2023-01-22 DIAGNOSIS — Z947 Corneal transplant status: Secondary | ICD-10-CM | POA: Diagnosis not present

## 2023-01-22 DIAGNOSIS — H35352 Cystoid macular degeneration, left eye: Secondary | ICD-10-CM | POA: Diagnosis not present

## 2023-01-22 DIAGNOSIS — H4440 Unspecified hypotony of eye: Secondary | ICD-10-CM | POA: Diagnosis not present

## 2023-01-22 DIAGNOSIS — H5372 Impaired contrast sensitivity: Secondary | ICD-10-CM | POA: Diagnosis not present

## 2023-01-22 DIAGNOSIS — R6889 Other general symptoms and signs: Secondary | ICD-10-CM | POA: Diagnosis not present

## 2023-01-22 DIAGNOSIS — H2701 Aphakia, right eye: Secondary | ICD-10-CM | POA: Diagnosis not present

## 2023-01-22 DIAGNOSIS — H44113 Panuveitis, bilateral: Secondary | ICD-10-CM | POA: Diagnosis not present

## 2023-03-26 DIAGNOSIS — Z947 Corneal transplant status: Secondary | ICD-10-CM | POA: Diagnosis not present

## 2023-03-26 DIAGNOSIS — H5372 Impaired contrast sensitivity: Secondary | ICD-10-CM | POA: Diagnosis not present

## 2023-03-26 DIAGNOSIS — R6889 Other general symptoms and signs: Secondary | ICD-10-CM | POA: Diagnosis not present

## 2023-03-26 DIAGNOSIS — Z961 Presence of intraocular lens: Secondary | ICD-10-CM | POA: Diagnosis not present

## 2023-03-26 DIAGNOSIS — H35352 Cystoid macular degeneration, left eye: Secondary | ICD-10-CM | POA: Diagnosis not present

## 2023-03-26 DIAGNOSIS — H4440 Unspecified hypotony of eye: Secondary | ICD-10-CM | POA: Diagnosis not present

## 2023-03-26 DIAGNOSIS — H44113 Panuveitis, bilateral: Secondary | ICD-10-CM | POA: Diagnosis not present

## 2023-04-22 ENCOUNTER — Encounter: Payer: Self-pay | Admitting: Family Medicine

## 2023-04-22 DIAGNOSIS — Z0000 Encounter for general adult medical examination without abnormal findings: Secondary | ICD-10-CM

## 2023-05-06 DIAGNOSIS — H2142 Pupillary membranes, left eye: Secondary | ICD-10-CM | POA: Diagnosis not present

## 2023-05-06 DIAGNOSIS — H11132 Conjunctival pigmentations, left eye: Secondary | ICD-10-CM | POA: Diagnosis not present

## 2023-05-06 DIAGNOSIS — H26492 Other secondary cataract, left eye: Secondary | ICD-10-CM | POA: Diagnosis not present

## 2023-05-06 DIAGNOSIS — H21563 Pupillary abnormality, bilateral: Secondary | ICD-10-CM | POA: Diagnosis not present

## 2023-05-06 DIAGNOSIS — H18412 Arcus senilis, left eye: Secondary | ICD-10-CM | POA: Diagnosis not present

## 2023-05-06 DIAGNOSIS — H44113 Panuveitis, bilateral: Secondary | ICD-10-CM | POA: Diagnosis not present

## 2023-05-06 DIAGNOSIS — R6889 Other general symptoms and signs: Secondary | ICD-10-CM | POA: Diagnosis not present

## 2023-05-06 DIAGNOSIS — Z961 Presence of intraocular lens: Secondary | ICD-10-CM | POA: Diagnosis not present

## 2023-05-20 DIAGNOSIS — H2142 Pupillary membranes, left eye: Secondary | ICD-10-CM | POA: Diagnosis not present

## 2023-05-20 DIAGNOSIS — R6889 Other general symptoms and signs: Secondary | ICD-10-CM | POA: Diagnosis not present

## 2023-05-28 ENCOUNTER — Other Ambulatory Visit: Payer: Self-pay | Admitting: Family Medicine

## 2023-05-28 DIAGNOSIS — Z1231 Encounter for screening mammogram for malignant neoplasm of breast: Secondary | ICD-10-CM

## 2023-06-11 DIAGNOSIS — H35352 Cystoid macular degeneration, left eye: Secondary | ICD-10-CM | POA: Diagnosis not present

## 2023-06-11 DIAGNOSIS — H4440 Unspecified hypotony of eye: Secondary | ICD-10-CM | POA: Diagnosis not present

## 2023-06-11 DIAGNOSIS — H44113 Panuveitis, bilateral: Secondary | ICD-10-CM | POA: Diagnosis not present

## 2023-06-11 DIAGNOSIS — H540X43 Blindness right eye category 4, blindness left eye category 3: Secondary | ICD-10-CM | POA: Diagnosis not present

## 2023-06-11 DIAGNOSIS — Z947 Corneal transplant status: Secondary | ICD-10-CM | POA: Diagnosis not present

## 2023-06-11 DIAGNOSIS — R6889 Other general symptoms and signs: Secondary | ICD-10-CM | POA: Diagnosis not present

## 2023-06-11 DIAGNOSIS — H2142 Pupillary membranes, left eye: Secondary | ICD-10-CM | POA: Diagnosis not present

## 2023-06-11 DIAGNOSIS — H2701 Aphakia, right eye: Secondary | ICD-10-CM | POA: Diagnosis not present

## 2023-06-11 DIAGNOSIS — H5372 Impaired contrast sensitivity: Secondary | ICD-10-CM | POA: Diagnosis not present

## 2023-07-03 IMAGING — MG MM DIGITAL SCREENING BILAT W/ TOMO AND CAD
8 series · 8 of 24 positions shown · non-contrast
Comparison: Previous exam(s).

CLINICAL DATA: Screening.

EXAM:
DIGITAL SCREENING BILATERAL MAMMOGRAM WITH TOMOSYNTHESIS AND CAD
TECHNIQUE: Bilateral screening digital craniocaudal and mediolateral oblique
mammograms were obtained. Bilateral screening digital breast
tomosynthesis was performed. The images were evaluated with
computer-aided detection.

[L MLO synth-2D]
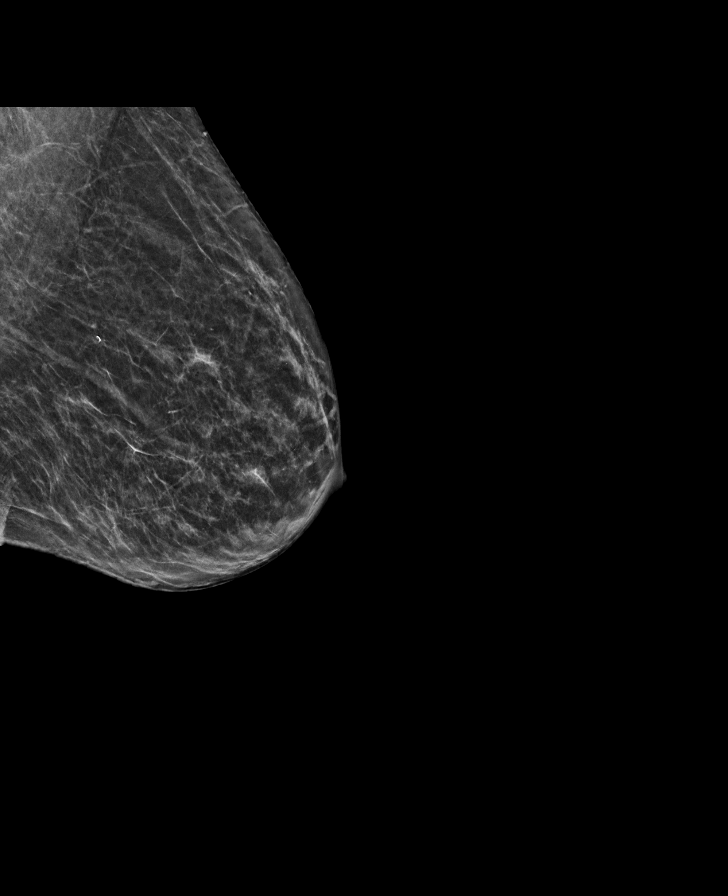

[R MLO synth-2D]
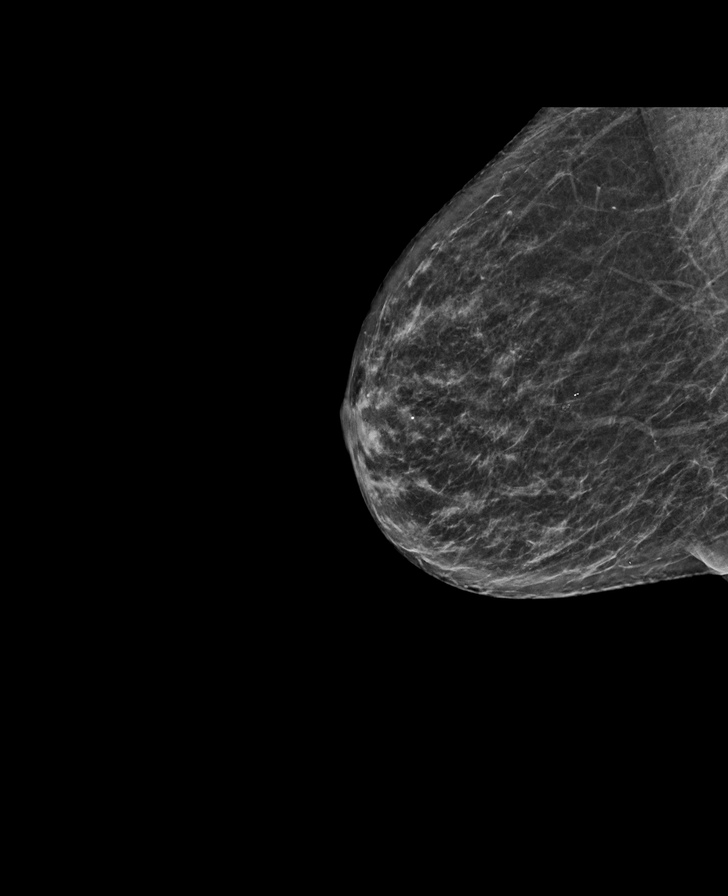

[R CC synth-2D]
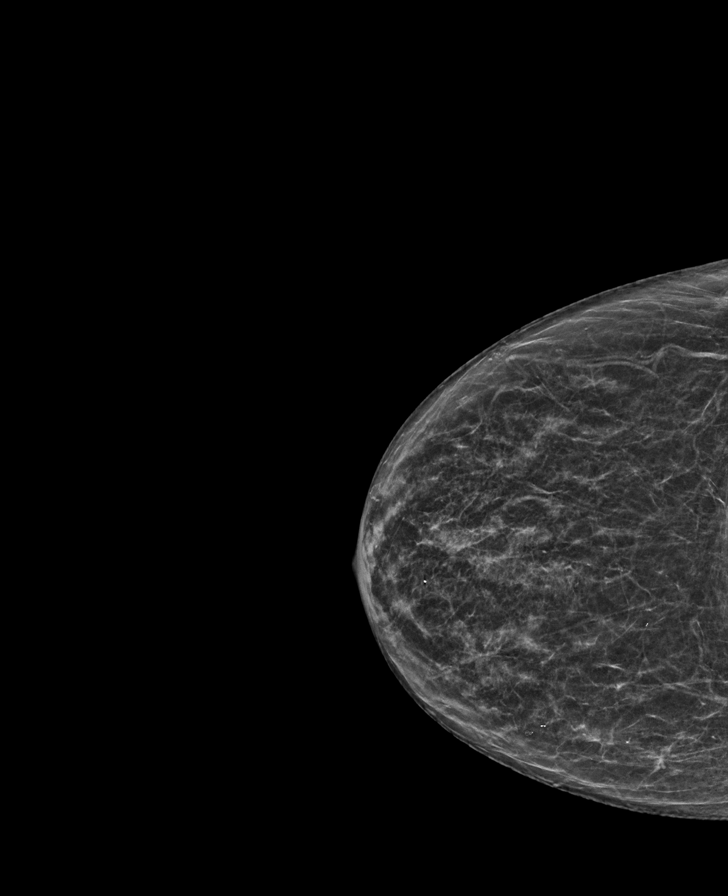

[L CC synth-2D]
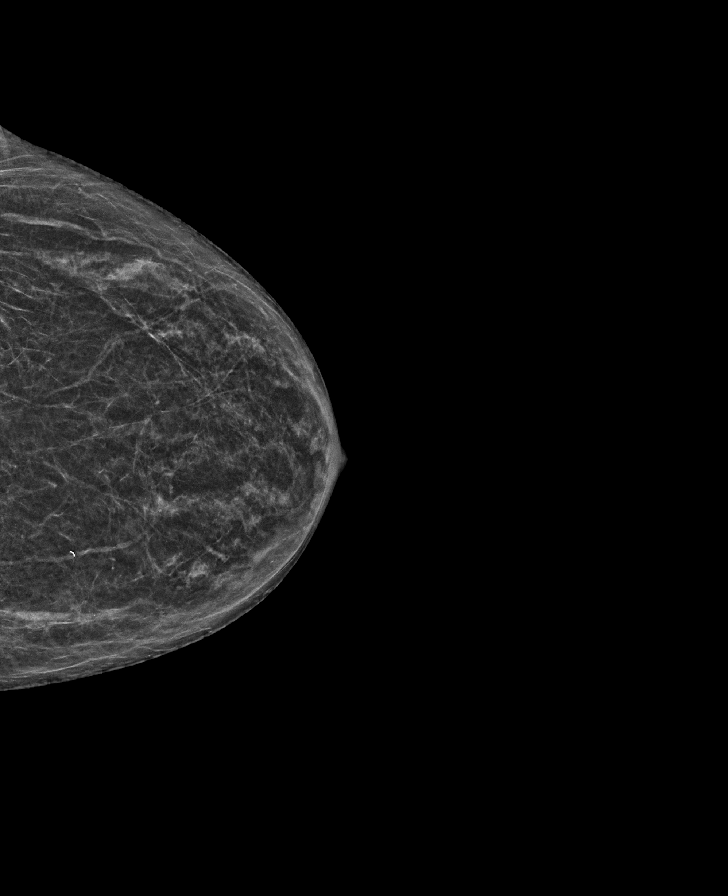

[L CC tomo · tomo slice 27/52.0]
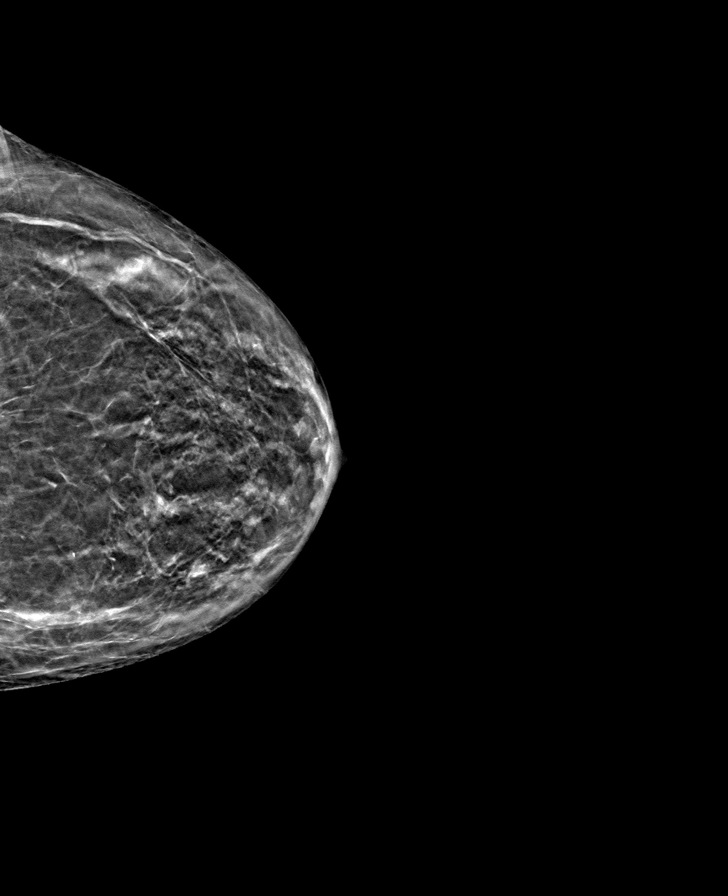

[L MLO tomo · tomo slice 29/57.0]
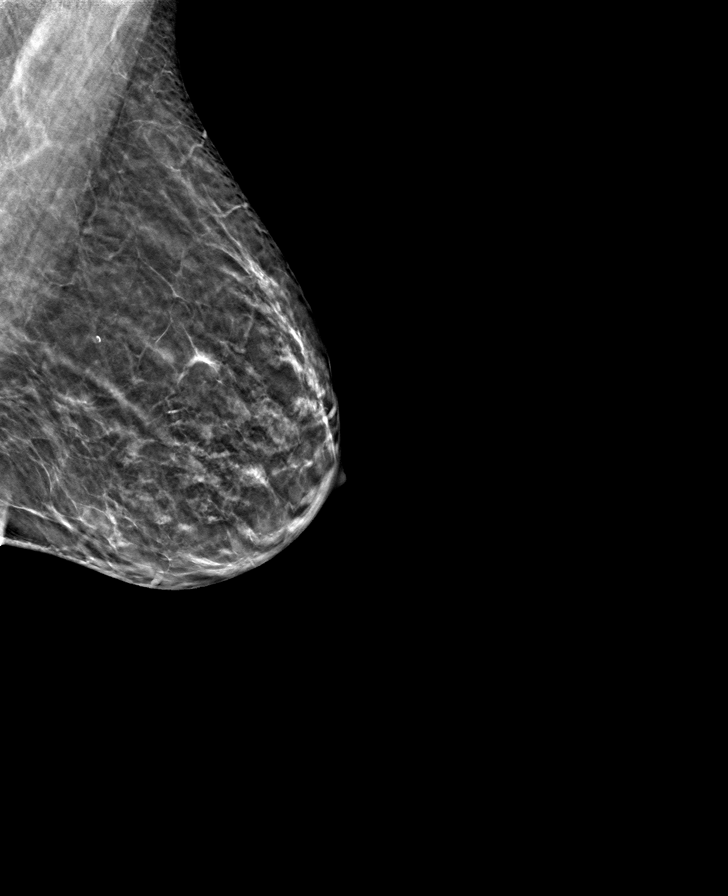

[R MLO tomo · tomo slice 29/58.0]
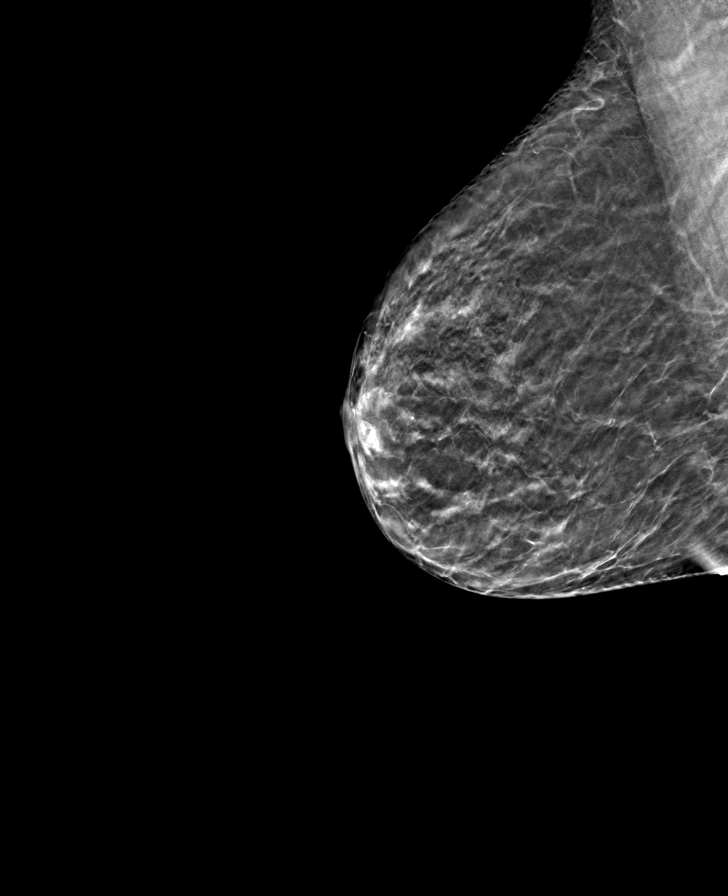

[R CC tomo · tomo slice 28/55.0]
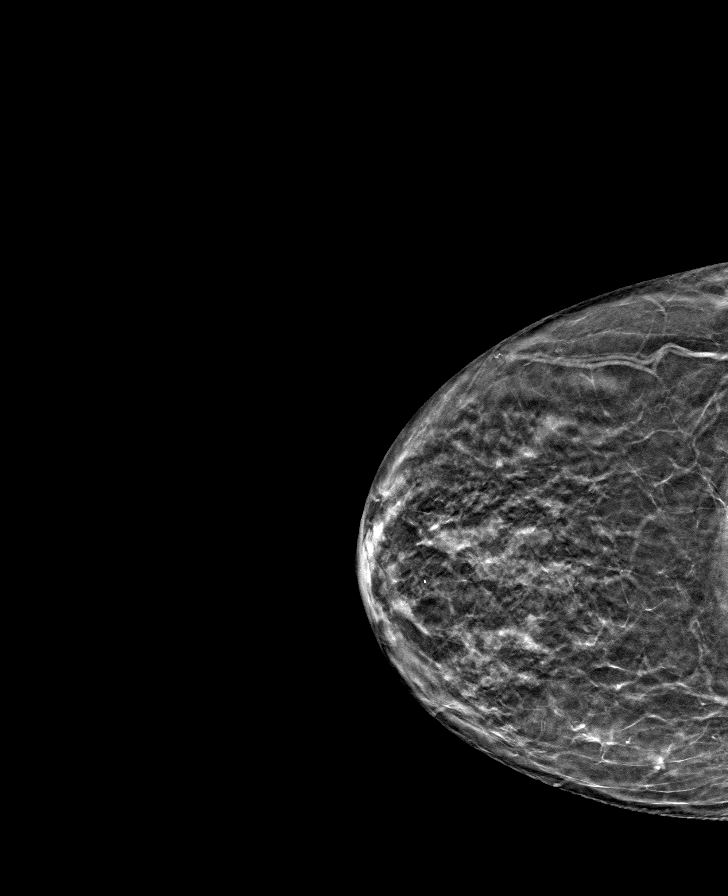

[8 of 24 positions shown; findings below may reference images not displayed]

ACR Breast Density Category b: There are scattered areas of
fibroglandular density.
FINDINGS: There are no findings suspicious for malignancy.
IMPRESSION: No mammographic evidence of malignancy. A result letter of this
screening mammogram will be mailed directly to the patient.

RECOMMENDATION:
Screening mammogram in one year. (Code:51-O-LD2)

BI-RADS CATEGORY  1: Negative.

## 2023-08-06 DIAGNOSIS — Z13220 Encounter for screening for lipoid disorders: Secondary | ICD-10-CM | POA: Diagnosis not present

## 2023-08-06 DIAGNOSIS — M810 Age-related osteoporosis without current pathological fracture: Secondary | ICD-10-CM | POA: Diagnosis not present

## 2023-08-06 DIAGNOSIS — R6889 Other general symptoms and signs: Secondary | ICD-10-CM | POA: Diagnosis not present

## 2023-08-06 DIAGNOSIS — Z136 Encounter for screening for cardiovascular disorders: Secondary | ICD-10-CM | POA: Diagnosis not present

## 2023-08-06 DIAGNOSIS — Z0000 Encounter for general adult medical examination without abnormal findings: Secondary | ICD-10-CM | POA: Diagnosis not present

## 2023-09-01 DIAGNOSIS — R6889 Other general symptoms and signs: Secondary | ICD-10-CM | POA: Diagnosis not present

## 2023-09-10 DIAGNOSIS — H2142 Pupillary membranes, left eye: Secondary | ICD-10-CM | POA: Diagnosis not present

## 2023-09-10 DIAGNOSIS — Z947 Corneal transplant status: Secondary | ICD-10-CM | POA: Diagnosis not present

## 2023-09-10 DIAGNOSIS — H35352 Cystoid macular degeneration, left eye: Secondary | ICD-10-CM | POA: Diagnosis not present

## 2023-09-10 DIAGNOSIS — H2701 Aphakia, right eye: Secondary | ICD-10-CM | POA: Diagnosis not present

## 2023-09-10 DIAGNOSIS — R6889 Other general symptoms and signs: Secondary | ICD-10-CM | POA: Diagnosis not present

## 2023-09-10 DIAGNOSIS — H4440 Unspecified hypotony of eye: Secondary | ICD-10-CM | POA: Diagnosis not present

## 2023-09-10 DIAGNOSIS — H5372 Impaired contrast sensitivity: Secondary | ICD-10-CM | POA: Diagnosis not present

## 2023-09-10 DIAGNOSIS — H44113 Panuveitis, bilateral: Secondary | ICD-10-CM | POA: Diagnosis not present

## 2023-10-09 DIAGNOSIS — R6889 Other general symptoms and signs: Secondary | ICD-10-CM | POA: Diagnosis not present

## 2023-10-23 DIAGNOSIS — R6889 Other general symptoms and signs: Secondary | ICD-10-CM | POA: Diagnosis not present

## 2023-10-31 ENCOUNTER — Ambulatory Visit
Admission: RE | Admit: 2023-10-31 | Discharge: 2023-10-31 | Disposition: A | Discharge status: Home / Self Care | Payer: Medicare HMO | Source: Ambulatory Visit | Attending: Family Medicine | Admitting: Family Medicine

## 2023-10-31 DIAGNOSIS — R6889 Other general symptoms and signs: Secondary | ICD-10-CM | POA: Diagnosis not present

## 2023-10-31 DIAGNOSIS — Z1231 Encounter for screening mammogram for malignant neoplasm of breast: Secondary | ICD-10-CM

## 2023-11-04 ENCOUNTER — Other Ambulatory Visit: Payer: Self-pay | Admitting: Internal Medicine

## 2023-11-04 DIAGNOSIS — R928 Other abnormal and inconclusive findings on diagnostic imaging of breast: Secondary | ICD-10-CM

## 2023-12-03 ENCOUNTER — Other Ambulatory Visit: Payer: Medicare HMO

## 2024-01-12 ENCOUNTER — Ambulatory Visit
Admission: RE | Admit: 2024-01-12 | Discharge: 2024-01-12 | Disposition: A | Discharge status: Home / Self Care | Payer: Medicare (Managed Care) | Source: Ambulatory Visit | Attending: Internal Medicine | Admitting: Internal Medicine

## 2024-01-12 ENCOUNTER — Ambulatory Visit
Admission: RE | Admit: 2024-01-12 | Discharge: 2024-01-12 | Disposition: A | Discharge status: Home / Self Care | Payer: Medicare HMO | Source: Ambulatory Visit | Attending: Internal Medicine

## 2024-01-12 DIAGNOSIS — R928 Other abnormal and inconclusive findings on diagnostic imaging of breast: Secondary | ICD-10-CM

## 2024-01-12 DIAGNOSIS — Z7689 Persons encountering health services in other specified circumstances: Secondary | ICD-10-CM | POA: Diagnosis not present

## 2024-02-18 DIAGNOSIS — H2701 Aphakia, right eye: Secondary | ICD-10-CM | POA: Diagnosis not present

## 2024-02-18 DIAGNOSIS — Z947 Corneal transplant status: Secondary | ICD-10-CM | POA: Diagnosis not present

## 2024-02-18 DIAGNOSIS — H44113 Panuveitis, bilateral: Secondary | ICD-10-CM | POA: Diagnosis not present

## 2024-02-18 DIAGNOSIS — H4440 Unspecified hypotony of eye: Secondary | ICD-10-CM | POA: Diagnosis not present

## 2024-02-18 DIAGNOSIS — H35352 Cystoid macular degeneration, left eye: Secondary | ICD-10-CM | POA: Diagnosis not present

## 2024-02-18 DIAGNOSIS — Z7689 Persons encountering health services in other specified circumstances: Secondary | ICD-10-CM | POA: Diagnosis not present

## 2024-02-18 DIAGNOSIS — H5372 Impaired contrast sensitivity: Secondary | ICD-10-CM | POA: Diagnosis not present

## 2024-02-18 DIAGNOSIS — H2142 Pupillary membranes, left eye: Secondary | ICD-10-CM | POA: Diagnosis not present

## 2024-03-04 ENCOUNTER — Other Ambulatory Visit: Payer: Self-pay | Admitting: Family Medicine

## 2024-03-04 DIAGNOSIS — R928 Other abnormal and inconclusive findings on diagnostic imaging of breast: Secondary | ICD-10-CM

## 2024-05-13 DIAGNOSIS — Z7689 Persons encountering health services in other specified circumstances: Secondary | ICD-10-CM | POA: Diagnosis not present

## 2024-05-19 DIAGNOSIS — Z7689 Persons encountering health services in other specified circumstances: Secondary | ICD-10-CM | POA: Diagnosis not present

## 2024-05-19 DIAGNOSIS — H44113 Panuveitis, bilateral: Secondary | ICD-10-CM | POA: Diagnosis not present

## 2024-05-19 DIAGNOSIS — H35352 Cystoid macular degeneration, left eye: Secondary | ICD-10-CM | POA: Diagnosis not present

## 2024-07-13 ENCOUNTER — Ambulatory Visit
Admission: RE | Admit: 2024-07-13 | Discharge: 2024-07-13 | Disposition: A | Discharge status: Home / Self Care | Payer: Medicare (Managed Care) | Source: Ambulatory Visit | Attending: Family Medicine | Admitting: Family Medicine

## 2024-07-13 ENCOUNTER — Other Ambulatory Visit: Payer: Self-pay | Admitting: Family Medicine

## 2024-07-13 DIAGNOSIS — R928 Other abnormal and inconclusive findings on diagnostic imaging of breast: Secondary | ICD-10-CM | POA: Diagnosis not present

## 2024-07-13 DIAGNOSIS — Z7689 Persons encountering health services in other specified circumstances: Secondary | ICD-10-CM | POA: Diagnosis not present

## 2024-07-13 DIAGNOSIS — N6489 Other specified disorders of breast: Secondary | ICD-10-CM

## 2024-08-02 DIAGNOSIS — Z13220 Encounter for screening for lipoid disorders: Secondary | ICD-10-CM | POA: Diagnosis not present

## 2024-08-02 DIAGNOSIS — Z131 Encounter for screening for diabetes mellitus: Secondary | ICD-10-CM | POA: Diagnosis not present

## 2024-08-02 DIAGNOSIS — Z7689 Persons encountering health services in other specified circumstances: Secondary | ICD-10-CM | POA: Diagnosis not present

## 2024-08-02 DIAGNOSIS — Z130 Encounter for screening for diseases of the blood and blood-forming organs and certain disorders involving the immune mechanism: Secondary | ICD-10-CM | POA: Diagnosis not present

## 2024-08-02 DIAGNOSIS — M810 Age-related osteoporosis without current pathological fracture: Secondary | ICD-10-CM | POA: Diagnosis not present

## 2024-08-06 DIAGNOSIS — Z131 Encounter for screening for diabetes mellitus: Secondary | ICD-10-CM | POA: Diagnosis not present

## 2024-08-06 DIAGNOSIS — Z7689 Persons encountering health services in other specified circumstances: Secondary | ICD-10-CM | POA: Diagnosis not present

## 2024-08-06 DIAGNOSIS — Z0000 Encounter for general adult medical examination without abnormal findings: Secondary | ICD-10-CM | POA: Diagnosis not present

## 2024-08-06 DIAGNOSIS — Z130 Encounter for screening for diseases of the blood and blood-forming organs and certain disorders involving the immune mechanism: Secondary | ICD-10-CM | POA: Diagnosis not present

## 2024-08-06 DIAGNOSIS — Z1211 Encounter for screening for malignant neoplasm of colon: Secondary | ICD-10-CM | POA: Diagnosis not present

## 2024-08-06 DIAGNOSIS — Z13220 Encounter for screening for lipoid disorders: Secondary | ICD-10-CM | POA: Diagnosis not present

## 2024-08-06 DIAGNOSIS — M810 Age-related osteoporosis without current pathological fracture: Secondary | ICD-10-CM | POA: Diagnosis not present

## 2024-09-07 DIAGNOSIS — Z7689 Persons encountering health services in other specified circumstances: Secondary | ICD-10-CM | POA: Diagnosis not present

## 2024-09-07 DIAGNOSIS — M8589 Other specified disorders of bone density and structure, multiple sites: Secondary | ICD-10-CM | POA: Diagnosis not present

## 2024-09-15 DIAGNOSIS — H2142 Pupillary membranes, left eye: Secondary | ICD-10-CM | POA: Diagnosis not present

## 2024-09-15 DIAGNOSIS — Z7689 Persons encountering health services in other specified circumstances: Secondary | ICD-10-CM | POA: Diagnosis not present

## 2024-09-15 DIAGNOSIS — H35352 Cystoid macular degeneration, left eye: Secondary | ICD-10-CM | POA: Diagnosis not present

## 2024-09-15 DIAGNOSIS — H44113 Panuveitis, bilateral: Secondary | ICD-10-CM | POA: Diagnosis not present

## 2024-09-15 DIAGNOSIS — H2701 Aphakia, right eye: Secondary | ICD-10-CM | POA: Diagnosis not present

## 2024-09-15 DIAGNOSIS — H4440 Unspecified hypotony of eye: Secondary | ICD-10-CM | POA: Diagnosis not present

## 2024-09-15 DIAGNOSIS — H5372 Impaired contrast sensitivity: Secondary | ICD-10-CM | POA: Diagnosis not present

## 2024-09-15 DIAGNOSIS — Z947 Corneal transplant status: Secondary | ICD-10-CM | POA: Diagnosis not present

## 2024-09-21 DIAGNOSIS — H6123 Impacted cerumen, bilateral: Secondary | ICD-10-CM | POA: Diagnosis not present

## 2024-09-21 DIAGNOSIS — Z7689 Persons encountering health services in other specified circumstances: Secondary | ICD-10-CM | POA: Diagnosis not present
